# Patient Record
Sex: Male | Born: 1980 | Hispanic: Yes | Marital: Married | Smoking: Former smoker
Health system: Southern US, Community
[De-identification: ages and names within clinical notes are randomized; demographics above are authoritative.]

## PROBLEM LIST (undated history)

## (undated) DIAGNOSIS — K921 Melena: Secondary | ICD-10-CM

## (undated) DIAGNOSIS — M5127 Other intervertebral disc displacement, lumbosacral region: Secondary | ICD-10-CM

## (undated) DIAGNOSIS — K259 Gastric ulcer, unspecified as acute or chronic, without hemorrhage or perforation: Secondary | ICD-10-CM

## (undated) DIAGNOSIS — K219 Gastro-esophageal reflux disease without esophagitis: Secondary | ICD-10-CM

## (undated) DIAGNOSIS — K760 Fatty (change of) liver, not elsewhere classified: Secondary | ICD-10-CM

## (undated) HISTORY — DX: Gastro-esophageal reflux disease without esophagitis: K21.9

## (undated) HISTORY — DX: Gastric ulcer, unspecified as acute or chronic, without hemorrhage or perforation: K25.9

## (undated) HISTORY — DX: Melena: K92.1

## (undated) HISTORY — PX: KNEE ARTHROSCOPY: SHX127

## (undated) HISTORY — PX: UPPER GASTROINTESTINAL ENDOSCOPY: SHX188

---

## 1998-10-12 ENCOUNTER — Emergency Department (HOSPITAL_COMMUNITY): Admission: EM | Admit: 1998-10-12 | Discharge: 1998-10-12 | Payer: Self-pay | Admitting: Emergency Medicine

## 2000-10-28 ENCOUNTER — Encounter: Payer: Self-pay | Admitting: Gastroenterology

## 2000-10-28 ENCOUNTER — Ambulatory Visit (HOSPITAL_COMMUNITY): Admission: RE | Admit: 2000-10-28 | Discharge: 2000-10-28 | Payer: Self-pay | Admitting: Gastroenterology

## 2001-02-11 ENCOUNTER — Encounter (INDEPENDENT_AMBULATORY_CARE_PROVIDER_SITE_OTHER): Payer: Self-pay | Admitting: *Deleted

## 2001-02-11 ENCOUNTER — Ambulatory Visit (HOSPITAL_COMMUNITY): Admission: RE | Admit: 2001-02-11 | Discharge: 2001-02-11 | Payer: Self-pay | Admitting: Gastroenterology

## 2001-02-22 ENCOUNTER — Encounter: Payer: Self-pay | Admitting: Gastroenterology

## 2001-02-22 ENCOUNTER — Encounter: Admission: RE | Admit: 2001-02-22 | Discharge: 2001-02-22 | Payer: Self-pay | Admitting: Gastroenterology

## 2002-10-26 ENCOUNTER — Emergency Department (HOSPITAL_COMMUNITY): Admission: AD | Admit: 2002-10-26 | Discharge: 2002-10-26 | Payer: Self-pay | Admitting: Emergency Medicine

## 2005-07-21 ENCOUNTER — Emergency Department (HOSPITAL_COMMUNITY): Admission: EM | Admit: 2005-07-21 | Discharge: 2005-07-21 | Payer: Self-pay | Admitting: Family Medicine

## 2005-07-27 ENCOUNTER — Ambulatory Visit (HOSPITAL_COMMUNITY): Admission: RE | Admit: 2005-07-27 | Discharge: 2005-07-27 | Payer: Self-pay | Admitting: Orthopedic Surgery

## 2006-07-01 ENCOUNTER — Emergency Department (HOSPITAL_COMMUNITY): Admission: EM | Admit: 2006-07-01 | Discharge: 2006-07-01 | Payer: Self-pay | Admitting: Family Medicine

## 2009-05-09 ENCOUNTER — Emergency Department (HOSPITAL_COMMUNITY): Admission: EM | Admit: 2009-05-09 | Discharge: 2009-05-09 | Payer: Self-pay | Admitting: Family Medicine

## 2010-03-07 ENCOUNTER — Ambulatory Visit: Payer: Self-pay | Admitting: Family Medicine

## 2010-03-07 ENCOUNTER — Ambulatory Visit (HOSPITAL_COMMUNITY): Admission: RE | Admit: 2010-03-07 | Discharge: 2010-03-07 | Payer: Self-pay | Admitting: Family Medicine

## 2010-03-07 DIAGNOSIS — R635 Abnormal weight gain: Secondary | ICD-10-CM

## 2010-03-07 DIAGNOSIS — E669 Obesity, unspecified: Secondary | ICD-10-CM

## 2010-03-07 DIAGNOSIS — R42 Dizziness and giddiness: Secondary | ICD-10-CM

## 2010-03-10 ENCOUNTER — Encounter: Payer: Self-pay | Admitting: Family Medicine

## 2010-04-23 ENCOUNTER — Ambulatory Visit: Payer: Self-pay | Admitting: Family Medicine

## 2010-04-23 DIAGNOSIS — E781 Pure hyperglyceridemia: Secondary | ICD-10-CM | POA: Insufficient documentation

## 2010-05-19 ENCOUNTER — Emergency Department (HOSPITAL_COMMUNITY)
Admission: EM | Admit: 2010-05-19 | Discharge: 2010-05-19 | Payer: Self-pay | Source: Home / Self Care | Admitting: Emergency Medicine

## 2010-07-06 LAB — CONVERTED CEMR LAB
ALT: 21 units/L (ref 0–53)
AST: 16 units/L (ref 0–37)
Albumin: 4.8 g/dL (ref 3.5–5.2)
Alkaline Phosphatase: 72 units/L (ref 39–117)
BUN: 15 mg/dL (ref 6–23)
CO2: 23 meq/L (ref 19–32)
Calcium: 9.3 mg/dL (ref 8.4–10.5)
Chloride: 107 meq/L (ref 96–112)
Cholesterol: 151 mg/dL (ref 0–200)
Creatinine, Ser: 1 mg/dL (ref 0.40–1.50)
Glucose, Bld: 101 mg/dL — ABNORMAL HIGH (ref 70–99)
HCT: 44.6 % (ref 39.0–52.0)
HDL: 32 mg/dL — ABNORMAL LOW (ref >39)
Hemoglobin: 15 g/dL (ref 13.0–17.0)
LDL Cholesterol: 68 mg/dL (ref 0–99)
MCHC: 33.6 g/dL (ref 30.0–36.0)
MCV: 90.7 fL (ref 78.0–100.0)
Platelets: 262 10*3/uL (ref 150–400)
Potassium: 4.8 meq/L (ref 3.5–5.3)
RBC: 4.92 M/uL (ref 4.22–5.81)
RDW: 13.4 % (ref 11.5–15.5)
Sodium: 141 meq/L (ref 135–145)
TSH: 2.027 microintl units/mL (ref 0.350–4.500)
Total Bilirubin: 0.5 mg/dL (ref 0.3–1.2)
Total CHOL/HDL Ratio: 4.7
Total Protein: 7 g/dL (ref 6.0–8.3)
Triglycerides: 253 mg/dL — ABNORMAL HIGH (ref <150)
VLDL: 51 mg/dL — ABNORMAL HIGH (ref 0–40)
WBC: 6.9 10*3/uL (ref 4.0–10.5)

## 2010-07-08 NOTE — Assessment & Plan Note (Signed)
Summary: NP,tcb   Vital Signs:  Patient profile:   30 year old male Height:      173.5 cm Weight:      226 pounds BMI:     34.18 Temp:     98.5 degrees F oral Pulse rate:   70 / minute BP sitting:   122 / 78  (right arm) Cuff size:   regular  Vitals Entered By: Tessie Fass CMA (March 07, 2010 9:47 AM) CC: NEW PT Pain Assessment Patient in pain? no        CC:  NEW PT.  History of Present Illness: New patient to our office, visit conducted mostly in Spanish with interspersed English.   Wayne Miller presents with concerns about episodes of feeling "like I could pass out".  Sudden onset, associated with brief (seconds) episodes of nausea without emesis.  Mostly when goes from sitting to standing positions.  Has seconds of blurred vision.  No palpitations, chest pain or dyspnea.  No fevers or chills.  Has had increased weight (25 lbs in the past 6 months since stopped working).  Had been working in [employer], not working for 6 months.  At home caring for 3 daughters (Ages 47, 8, 2 yrs).  Wife is working.  He is not physically active.  No tobacco or drugs.  Drinks 3 to 6 beers at a sitting, one or two times a week to relax.    Habits & Providers  Alcohol-Tobacco-Diet     Tobacco Status: never  Family History: Father with DM, HTN, ESRD on HD, heart disease.  Mother wiht HTN, highcholesterol, 2 sisters and 3 brothers, none of whom have DM or known heart disease.   Social History: Former smoker (quit years ago).  Drinks 3 to 6 beers on a sitting, a few times a week.  Caring for three daughter (ages 2, 19, 37).  Wife working now.  Formerly worked in [employer] representative. Smoking Status:  never  Review of Systems       Reports weight gain 25lbs in 6 mos; denies fevers or chills, no chest pain, no diarrhea or constipation, no blood in stool, no emesis. no fainting (but has had presyncope), no polyphagia or polydipsia, no polyuria or dysuria.  No abd pain.    Physical Exam  General:   well appearing, no apparent distress.   Eyes:  clear sclerae. PERRL.  Ears:  External ear exam shows no significant lesions or deformities.  Otoscopic examination reveals clear canals, tympanic membranes are intact bilaterally without bulging, retraction, inflammation or discharge. Hearing is grossly normal bilaterally. Mouth:  Mildly dry mucus membranes noted. Clear oropharynx. Neck:  Neck supple. No adenopathy noted.  Possible early acanthosis around nape of neck.  Thyroid supple, non-nodular.  Lungs:  Normal respiratory effort, chest expands symmetrically. Lungs are clear to auscultation, no crackles or wheezes. Heart:  Normal rate and regular rhythm. S1 and S2 normal without gallop, murmur, click, rub or other extra sounds. Abdomen:  Bowel sounds positive,abdomen soft and non-tender without masses, organomegaly or hernias noted. No organomegaly.  Extremities:  No edema in lower extremities.  Palpable dp pulses bilaterally.  Neurologic:  gait normal.     Impression & Recommendations:  Problem # 1:  DIZZINESS (ICD-780.4) Episodes presyncope.  No LOC ever, per his report.  ECG today with [phone] (not prolonged).  Consider Holter monitor if episodes frequent enough to capture.  Will workup for possible DM, consider orthostatic BP at next visit if labs not helpful in delineating a cause.  Orders: 12 Lead EKG (12 Lead EKG) TSH-[facility] ([id number])  Problem # 2:  WEIGHT GAIN (ICD-783.1) Likely secondary to inactivity, possibly increased eating due to being out of work.  COnsider PHQ9 for depression screen at next visit.  Labs and lipid profile to assess for risk.  Orders: Lipid-[facility] [phone]) Comp Met-[facility] [id number]) CBC-[facility] [phone]) TSH[facility] [phone])  Other Orders: Influenza Vaccine NON MCR (22025)  Patient Instructions: 1)  Fue un placer verle hoy.  2)  Mandamos hacer varios estudios de sangre y Mudlogger.  Le entro en [employer]. 3)  Mantenga un  cuaderno con las fechas y horas en que le pasan los episodios.  Quiero que traiga este record cuando nos veamos proximamente.  4)  Quiero que vuelva en 4 semanas.  5)  FOLLOWUP WITH DR Mauricio Po IN 3 to 5 WEEKS. 6)  FLU SHOT TODAY 7)  ECG TODAY   Immunizations Administered:  Influenza Vaccine # 1:    Vaccine Type: Fluvax Non-MCR    Site: left deltoid    Mfr: GlaxoSmithKline    Dose: 0.5 ml    Route: IM    Given by: Tessie Fass CMA    Exp. Date: 12/03/2010    Lot #: [id number]    VIS given: 12/31/09 version given March 07, 2010.  Flu Vaccine Consent Questions:    Do you have a history of severe allergic reactions to this vaccine? no    Any prior history of allergic reactions to egg and/or gelatin? no    Do you have a sensitivity to the preservative Thimersol? no    Do you have a past history of Guillan-Barre Syndrome? no    Do you currently have an acute febrile illness? no    Have you ever had a severe reaction to latex? no    Vaccine information given and explained to patient? yes

## 2010-07-08 NOTE — Assessment & Plan Note (Signed)
Summary: fu/mj   Vital Signs:  Patient profile:   30 year old male Weight:      227 pounds Temp:     98.0 degrees F oral Pulse rate:   65 / minute BP sitting:   124 / 80  (right arm) Cuff size:   regular  Vitals Entered By: Tessie Fass CMA (April 23, 2010 8:58 AM) CC: F/U Is Patient Diabetic? No Pain Assessment Patient in pain? no        CC:  F/U.  History of Present Illness: Visit conducted in [place].   Wayne Miller comes in today for followup.  His sxs of presyncope have completely resolved.  No further episodes since our last meeting.  PHQ9 done today in Spanish is zero.   He did receive my letter regarding his labs, specifically the abnormally high TGs and low HDL.  Discussed diet and activity.  Not particularly active now, watches his daughters while wife works.  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    Eats a fair amt of bread, some white rice.  Tortillas when goes to visit his mother.  Not much of a sweet tooth.  Drinks 6-pack of beer once every month or so.  No other alcohol.  Takes OTC fish oil caps, 2 caps daily.  No adverse symptoms from these.   Nonsmoker (never smoked).   Also concerned about discoloration of toenails.  Wouldl like to consider treatment for this.   Habits & Providers  Alcohol-Tobacco-Diet     Tobacco Status: never  Current Medications (verified): 1)  Terbinafine Hcl 250 Mg Tabs (Terbinafine Hcl) .... Sig: Take 1 Tab By Mouth One Time Daily For 3 Months  Allergies (verified): No Known Drug Allergies  Physical Exam  General:  well appearing, no apparent distress Mouth:  moist mucus membranes Neck:  neck supple withuot adenopathy Lungs:  Normal respiratory effort, chest expands symmetrically. Lungs are clear to auscultation, no crackles or wheezes. Heart:  Normal rate and regular rhythm. S1 and S2 normal without gallop, murmur, click, rub or other extra sounds. Msk:  toenails and fingernails with yellow discoloration; gnawed  appearance at peripheral edges. No nailbed erythema or paronychiae   Impression & Recommendations:  Problem # 1:  HYPERTRIGLYCERIDEMIA (ICD-272.1) Discussed possible causes of this, as well as association with impaired fasting glucose (fasting glucose 101 on this study).  Weight reduction, watch dietary items that can increase TGs.  Increase exercise and fish oils for increase in HDL (beneficial).  To recheck lipid panel in 2 months or so.  Orders: [facility]- Est Level  3 (99213)Future Orders: Lipid-[facility] ([id number]) ... 04/23/2011  Problem # 2:  ONYCHOMYCOSIS, BILATERAL (ICD-110.1) Given normal transaminases, will treat with 12 weeks of oral lamisil.  Discussed expectation that this may take 1 yr to note full recovery.  His updated medication list for this problem includes:    Terbinafine Hcl 250 Mg Tabs (Terbinafine hcl) ..... Sig: take 1 tab by mouth one time daily for 3 months  Orders: [facility]- Est Level  3 (99213)Future Orders: Comp Met-[facility] ([id number]) ... 04/22/2011  Problem # 3:  DIZZINESS (ICD-780.4) Presyncope resolved. PHQ9 done today to assess for possible depressive disorder as cause.  PHQ9 is zero.  No further workup for this.   Complete Medication List: 1)  Terbinafine Hcl 250 Mg Tabs (Terbinafine hcl) .... Sig: take 1 tab by mouth one time daily for 3 months  Patient Instructions: 1)  Fue un placer verle hoy. 2)  Para bajar los triglicerios, quiero que  limite el pan, la pasta, y el arroz blanco en la dieta.  Si va a comer pan, es mejor que sea pan integral.   3)  Limitar los dulces, pasteles y el alcohol.  4)  Aumentar el consumo de vegetales, sobretodo los que contienen [address] (brocoli, col, espinaca, [place], frijoles).  5)  Quiero que aumente el aceite de pescado a 4 capsulas por dia (2 por la [employer] y [place] por la tarde).   6)  Trate de hacer mas actividad fisica, aunque sea por 20 minutos diario de caminar.   7)  QUiero chequear el laboratorio EN AYUNAS 8 HORAS en el  mes de enero.  8)  Mande' una receta para Luana Shu para los hongos de los pies y  de las unas de las [place], a Statistician en [employer] con la [address].  Debe tomar una tableta por dia, por 3 meses.  Los [employer] unas pueden demorar meses para ser notables.   Prescriptions: TERBINAFINE HCL 250 MG TABS (TERBINAFINE HCL) SIG: Take 1 tab by mouth one time daily for 3 months  #30 x 3   Entered and Authorized by:   Paula Compton MD   Signed by:   Paula Compton MD on 04/23/2010   Method used:   Electronically to        [facility] [phone]* (retail)       [address]       [place], [place]  [address]       Ph: [phone]       Fax: [phone]   RxID:   [id number]    Orders Added: 1)  Lipid-[facility] [[id number]] 2)  Comp Met-[facility] [[id number]] 3)  [facility]- Est Level  3 [60737]

## 2010-07-08 NOTE — Letter (Signed)
Summary: Generic Letter  [facility]  [address]   [place], [place] [address]   Phone: [phone]  Fax: [phone]    03/10/2010  [facility] [address] [place], [place]  [address]  Lewis Shock,   Espero que esta carta le encuentre bien.  Escribo para decire que la [employer] de los estudios que mandamos hacer el pasado viernes, 30 septiembre, salieron bien.   El perfil lipidico(colesterol) salio' con algunos resultados anormales que debemos conversar en su proxima visita.   Quisiera volver a verle en la consulta dentro de seis semanas.    Sinceramente,   Paula Compton MD  Appended Document: Generic Letter mailed

## 2010-08-01 ENCOUNTER — Encounter: Payer: Self-pay | Admitting: *Deleted

## 2010-09-09 LAB — POCT RAPID STREP A (OFFICE): Streptococcus, Group A Screen (Direct): POSITIVE — AB

## 2010-10-24 NOTE — Op Note (Signed)
[facility]  Patient:    Wayne Miller, Wayne Miller Visit Number: [id number] MRN: [id number]          Service Type: END Location: ENDO Attending Physician:  Charna Elizabeth Dictated by:   Anselmo Rod, M.D. Proc. Date: 02/11/01 Admit Date:  02/11/2001   CC:         Davis L. Cloward, M.D.   Operative Report  DATE OF BIRTH:  11/04/1980.  PROCEDURE:  Esophagogastroduodenoscopy with biopsies.  ENDOSCOPIST:  Anselmo Rod, M.D.  INSTRUMENT USED:  Olympus video panendoscope.  INDICATION FOR PROCEDURE:  Epigastric pain not responding to PPIs in a 30 year old Hispanic male.  Rule out peptic ulcer disease.  PREPROCEDURE PREPARATION:  Informed consent was procured from the patient. The patient was fasted for eight hours prior to the procedure.  PREPROCEDURE PHYSICAL:  VITAL SIGNS:  The patient had stable vital signs.  NECK:  Supple.  CHEST:  Clear to auscultation.  S1, S2 regular.  ABDOMEN:  Soft with epigastric tenderness on palpation with guarding.  No rebound or rigidity.  DESCRIPTION OF PROCEDURE:  The patient was placed in the left lateral decubitus position and sedated with 60 mg of Demerol and 6 mg of Versed intravenously.  Once the patient was adequately sedate and maintained on low-flow oxygen and continuous cardiac monitoring, the Olympus video panendoscope was advanced through the mouthpiece, over the tongue, into the esophagus under direct vision.  The entire esophagus appeared normal and without lesions.  There was a small hiatal hernia seen on retroflexion with antral gastritis.  Biopsies were done to rule out the presence of Helicobacter pylori by CLOtest.  The proximal small bowel appeared normal.  There was no evidence of ulceration.  IMPRESSION: 1. Small hiatal hernia. 2. Antral gastritis. 3. Biopsies done to rule out Helicobacter pylori by pathology.  RECOMMENDATIONS: 1. Await pathology results. 2. Continue  PPIs. 3. Avoid all nonsteroidals. 4. Outpatient follow-up in the next two weeks. Dictated by:   Anselmo Rod, M.D. Attending Physician:  Charna Elizabeth DD:  02/11/01 TD:  02/12/01 Job: [id number]

## 2011-01-19 ENCOUNTER — Inpatient Hospital Stay (INDEPENDENT_AMBULATORY_CARE_PROVIDER_SITE_OTHER)
Admission: RE | Admit: 2011-01-19 | Discharge: 2011-01-19 | Disposition: A | Discharge status: Home / Self Care | Payer: PRIVATE HEALTH INSURANCE | Source: Ambulatory Visit | Attending: Family Medicine | Admitting: Family Medicine

## 2011-01-19 DIAGNOSIS — L03039 Cellulitis of unspecified toe: Secondary | ICD-10-CM

## 2011-08-25 ENCOUNTER — Other Ambulatory Visit: Payer: Self-pay | Admitting: Family Medicine

## 2011-08-25 NOTE — Telephone Encounter (Signed)
Refill request

## 2015-05-17 ENCOUNTER — Emergency Department (HOSPITAL_COMMUNITY)
Admission: EM | Admit: 2015-05-17 | Discharge: 2015-05-17 | Disposition: A | Discharge status: Home / Self Care | Payer: Worker's Compensation | Attending: Emergency Medicine | Admitting: Emergency Medicine

## 2015-05-17 ENCOUNTER — Encounter (HOSPITAL_COMMUNITY): Payer: Self-pay | Admitting: Emergency Medicine

## 2015-05-17 ENCOUNTER — Emergency Department (HOSPITAL_COMMUNITY)
Admission: EM | Admit: 2015-05-17 | Discharge: 2015-05-17 | Discharge status: Absent without leave | Payer: Self-pay | Source: Home / Self Care

## 2015-05-17 DIAGNOSIS — M545 Low back pain: Secondary | ICD-10-CM | POA: Diagnosis not present

## 2015-05-17 LAB — URINALYSIS, ROUTINE W REFLEX MICROSCOPIC
Bilirubin Urine: NEGATIVE
GLUCOSE, UA: NEGATIVE mg/dL
HGB URINE DIPSTICK: NEGATIVE
Ketones, ur: NEGATIVE mg/dL
Leukocytes, UA: NEGATIVE
Nitrite: NEGATIVE
PH: 7 (ref 5.0–8.0)
Protein, ur: NEGATIVE mg/dL
SPECIFIC GRAVITY, URINE: 1.009 (ref 1.005–1.030)

## 2015-05-17 MED ORDER — OXYCODONE-ACETAMINOPHEN 5-325 MG PO TABS
1.0000 | ORAL_TABLET | Freq: Once | ORAL | Status: AC
  Administered 2015-05-17: 1 via ORAL

## 2015-05-17 MED ORDER — KETOROLAC TROMETHAMINE 60 MG/2ML IM SOLN
60.0000 mg | Freq: Once | INTRAMUSCULAR | Status: AC
  Administered 2015-05-17: 60 mg via INTRAMUSCULAR
  Filled 2015-05-17: qty 2

## 2015-05-17 MED ORDER — HYDROCODONE-ACETAMINOPHEN 5-325 MG PO TABS: 2.0000 | ORAL_TABLET | ORAL | Status: DC | PRN

## 2015-05-17 MED ORDER — OXYCODONE-ACETAMINOPHEN 5-325 MG PO TABS
ORAL_TABLET | ORAL | Status: AC
  Filled 2015-05-17: qty 1

## 2015-05-17 MED ORDER — METHOCARBAMOL 500 MG PO TABS: 500.0000 mg | ORAL_TABLET | Freq: Two times a day (BID) | ORAL | Status: DC

## 2015-05-17 MED ORDER — DICLOFENAC SODIUM 50 MG PO TBEC: 50.0000 mg | DELAYED_RELEASE_TABLET | Freq: Two times a day (BID) | ORAL | Status: DC

## 2015-05-17 NOTE — ED Notes (Signed)
Per pt, started having bilateral flank pain since yesterday, denies urinary symptoms or fevers. Pt in NAD at this time, ambulatory.

## 2015-05-17 NOTE — Discharge Instructions (Signed)
Dolor de espalda en adultos  (Back Pain, Adult)  El dolor de espalda es muy frecuente en los adultos. La causa del dolor de espalda es rara vez peligrosa y el dolor a menudo mejora con el tiempo. Es posible que se desconozca la causa de esta afección. Algunas causas comunes son las siguientes:  · Distensión de los músculos o ligamentos que sostienen la columna vertebral.  · Desgaste (degeneración) de los discos vertebrales.  · Artritis.  · Lesiones directas en la espalda.  En muchas personas, el dolor de espalda es recurrente. Como rara vez es peligroso, las personas pueden aprender a manejar esta afección por sí mismas.  INSTRUCCIONES PARA EL CUIDADO EN EL HOGAR  Controle su dolor de espalda a fin de detectar algún cambio. Las siguientes indicaciones ayudarán a aliviar cualquier molestia que pueda sentir:  · Permanezca activo. Si permanece sentado o de pie en un mismo lugar durante mucho tiempo, se tensiona la espalda. No se siente, conduzca o permanezca de pie en un mismo lugar durante más de 30 minutos seguidos. Realice caminatas cortas en superficies planas tan pronto como le sea posible. Trate de caminar un poco más de tiempo cada día.  · Haga ejercicio regularmente como se lo haya indicado el médico. El ejercicio ayuda a que su espalda se cure más rápidamente. También ayuda a prevenir futuras lesiones al mantener los músculos fuertes y flexibles.  · No permanezca en la cama. Si hace reposo más de 1 a 2 días, puede demorar su recuperación.  · Preste atención a su cuerpo al inclinarse y levantarse. Las posiciones más cómodas son las que ejercen menos tensión en la espalda en recuperación. Siempre use técnicas apropiadas para levantar objetos, como por ejemplo:    Flexionar las rodillas.    Mantener la carga cerca del cuerpo.    No torcerse.  · Encuentre una posición cómoda para dormir. Use un colchón firme y recuéstese de costado con las rodillas ligeramente flexionadas. Si se recuesta sobre la espalda, coloque  una almohada debajo de las rodillas.  · Evite sentir ansiedad o estrés. El estrés aumenta la tensión muscular y puede empeorar el dolor de espalda. Es importante reconocer si se siente ansioso o estresado y aprender maneras de controlarlo, por ejemplo haciendo ejercicio.  · Tome los medicamentos solamente como se lo haya indicado el médico. Los medicamentos de venta libre para aliviar el dolor y la inflamación a menudo son los más eficaces. El médico puede recetarle relajantes musculares. Estos medicamentos ayudan a calmar el dolor de modo que pueda reanudar más rápidamente sus actividades normales y el ejercicio saludable.  · Aplique hielo sobre la zona lesionada.    Ponga el hielo en una bolsa plástica.    Coloque una toalla entre la piel y la bolsa de hielo.    Deje el hielo durante 20 minutos, 2 a 3 veces por día, durante los primeros 2 o 3 días. Después de eso, puede alternar el hielo y el calor para reducir el dolor y los espasmos.  · Mantenga un peso saludable. El exceso de peso ejerce presión adicional sobre la espalda y hace que resulte difícil mantener una buena postura.  SOLICITE ATENCIÓN MÉDICA SI:  · Siente un dolor que no se alivia con reposo o medicamentos.  · Siente mucho dolor que se extiende a las piernas o los glúteos.  · El dolor no mejora en una semana.  · Siente dolor por la noche.  · Pierde peso.  · Siente escalofríos o fiebre.  SOLICITE ATENCIÓN MÉDICA DE INMEDIATO SI:   ·   Tiene nuevos problemas para controlar la vejiga o los intestinos.  · Siente debilidad o adormecimiento inusuales en los brazos o en las piernas.  · Siente náuseas o vómitos.  · Siente dolor abdominal.  · Siente que va a desmayarse.     Esta información no tiene como fin reemplazar el consejo del médico. Asegúrese de hacerle al médico cualquier pregunta que tenga.     Document Released: 05/25/2005 Document Revised: 06/15/2014  Elsevier Interactive Patient Education ©2016 Elsevier Inc.

## 2015-05-17 NOTE — ED Provider Notes (Signed)
CSN: [id number]     Arrival date & time 05/17/15  1319 History   First MD Initiated Contact with Patient 05/17/15 1652     Chief Complaint  Patient presents with  . Flank Pain     (Consider location/radiation/quality/duration/timing/severity/associated sxs/prior Treatment) Patient is a 34 y.o. male presenting with back pain. The history is provided by the patient. No language interpreter was used.  Back Pain Location:  Lumbar spine Quality:  Aching and stabbing Radiates to:  Does not radiate Pain severity:  Severe Pain is:  Same all the time Onset quality:  Gradual Duration:  1 day Timing:  Constant Progression:  Worsening Chronicity:  New Context: not physical stress and not recent illness   Relieved by:  Nothing Worsened by:  Nothing tried Ineffective treatments:  None tried Associated symptoms: no abdominal pain, no fever, no leg pain, no tingling and no weakness     History reviewed. No pertinent past medical history. History reviewed. No pertinent past surgical history. No family history on file. Social History  Substance Use Topics  . Smoking status: Never Smoker   . Smokeless tobacco: None  . Alcohol Use: No    Review of Systems  Constitutional: Negative for fever.  Gastrointestinal: Negative for abdominal pain.  Musculoskeletal: Positive for back pain.  Neurological: Negative for tingling and weakness.  All other systems reviewed and are negative.     Allergies  Review of patient's allergies indicates no known allergies.  Home Medications   Prior to Admission medications   Medication Sig Start Date End Date Taking? Authorizing Provider  terbinafine (LAMISIL) 250 MG tablet Take 250 mg by mouth daily. For 3 months     Historical Provider, MD   BP 149/99 mmHg  Pulse 75  Temp(Src) 98 F (36.7 C) (Oral)  Resp 16  SpO2 99% Physical Exam  Constitutional: He appears well-developed and well-nourished.  HENT:  Head: Normocephalic.  Right Ear: External  ear normal.  Left Ear: External ear normal.  Nose: Nose normal.  Mouth/Throat: Oropharynx is clear and moist.  Eyes: Pupils are equal, round, and reactive to light.  Neck: Normal range of motion.  Cardiovascular: Normal rate and normal heart sounds.   Pulmonary/Chest: Effort normal and breath sounds normal.  Abdominal: Soft.  Musculoskeletal: Normal range of motion.  Tender ls spine  Pain with movement, pain with bilat straight leg raise.    Neurological: He is alert.  Skin: Skin is warm.  Psychiatric: He has a normal mood and affect.  Nursing note and vitals reviewed.   ED Course  Procedures (including critical care time) Labs Review Labs Reviewed  URINALYSIS, ROUTINE W REFLEX MICROSCOPIC (NOT AT [facility])    Imaging Review No results found. I have personally reviewed and evaluated these images and lab results as part of my medical decision-making.   EKG Interpretation None      MDM pt reports no relief with percocet one here.  Pt given torodol IM.  Urine no blood.   I think pain is muscular. Pt reports feeling better after torodol.  Pt given rx for ibuprofen, robaxin and hydrocodone    Final diagnoses:  Low back pain, unspecified back pain laterality, with sciatica presence unspecified    Meds ordered this encounter  Medications  . oxyCODONE-acetaminophen (PERCOCET/ROXICET) 5-325 MG per tablet 1 tablet    Sig:   . oxyCODONE-acetaminophen (PERCOCET/ROXICET) 5-325 MG per tablet    Sig:     Ocie Bob   : cabinet override  . ketorolac (  TORADOL) injection 60 mg    Sig:   . methocarbamol (ROBAXIN) 500 MG tablet    Sig: Take 1 tablet (500 mg total) by mouth 2 (two) times daily.    Dispense:  20 tablet    Refill:  0    Order Specific Question:  Supervising Provider    Answer:  MILLER, BRIAN [[id number]]  . HYDROcodone-acetaminophen (NORCO/VICODIN) 5-325 MG tablet    Sig: Take 2 tablets by mouth every 4 (four) hours as needed.    Dispense:  16 tablet    Refill:  0     Order Specific Question:  Supervising Provider    Answer:  MILLER, BRIAN [[id number]]  . methocarbamol (ROBAXIN) 500 MG tablet    Sig: Take 1 tablet (500 mg total) by mouth 2 (two) times daily.    Dispense:  20 tablet    Refill:  0    Order Specific Question:  Supervising Provider    Answer:  MILLER, BRIAN [[id number]]  . diclofenac (VOLTAREN) 50 MG EC tablet    Sig: Take 1 tablet (50 mg total) by mouth 2 (two) times daily.    Dispense:  20 tablet    Refill:  0    Order Specific Question:  Supervising Provider    Answer:  Noemi Chapel [[id number]]    An After Visit Summary was printed and given to the patient.  Hollace Kinnier [place], PA-C 05/17/15 1904  Dorie Rank, MD 05/18/15 [phone]

## 2015-05-23 ENCOUNTER — Emergency Department (HOSPITAL_COMMUNITY): Payer: Worker's Compensation

## 2015-05-23 ENCOUNTER — Emergency Department (HOSPITAL_COMMUNITY)
Admission: EM | Admit: 2015-05-23 | Discharge: 2015-05-23 | Disposition: A | Discharge status: Home / Self Care | Payer: Worker's Compensation | Attending: Emergency Medicine | Admitting: Emergency Medicine

## 2015-05-23 ENCOUNTER — Encounter (HOSPITAL_COMMUNITY): Payer: Self-pay | Admitting: *Deleted

## 2015-05-23 DIAGNOSIS — Z79899 Other long term (current) drug therapy: Secondary | ICD-10-CM | POA: Insufficient documentation

## 2015-05-23 DIAGNOSIS — Z791 Long term (current) use of non-steroidal anti-inflammatories (NSAID): Secondary | ICD-10-CM | POA: Insufficient documentation

## 2015-05-23 DIAGNOSIS — M7918 Myalgia, other site: Secondary | ICD-10-CM

## 2015-05-23 DIAGNOSIS — M545 Low back pain: Secondary | ICD-10-CM | POA: Diagnosis not present

## 2015-05-23 MED ORDER — NAPROXEN 500 MG PO TABS: 500.0000 mg | ORAL_TABLET | Freq: Two times a day (BID) | ORAL | Status: DC

## 2015-05-23 NOTE — ED Provider Notes (Signed)
CSN: [id number]     Arrival date & time 05/23/15  0810 History  By signing my name below, I, Wayne Miller, attest that this documentation has been prepared under the direction and in the presence of Margarita Mail, PA-C.  Electronically Signed: Eustaquio Miller, ED Scribe. 05/23/2015. 9:31 AM.   Chief Complaint  Patient presents with  . Back Pain   The history is provided by the patient. The history is limited by a language barrier. A language interpreter was used.     HPI Comments: Wayne Miller is a 34 y.o. male who presents to the Emergency Department complaining of gradual onset, constant, unchanged, 810, lower back pain radiating to bilateral posterior upper legs x 8 days. The pain is exacerbated with prolonged walking, prolonged standing still, twisting, and turning. 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 He was seen in th ED on 05/17/2015 (1 week ago) for the same symptoms and given prescriptions for Robaxin, Norco, and Voltaren. Pt reports that the medications have been giving him relief but he still has pain when he is not taken the meds. He denies weakness, numbness, frequency, urgency, dysuria, unexpected weight loss, loss of appetite, fever, night sweats, or any other associated symptoms.    History reviewed. No pertinent past medical history. History reviewed. No pertinent past surgical history. History reviewed. No pertinent family history. Social History  Substance Use Topics  . Smoking status: Never Smoker   . Smokeless tobacco: None  . Alcohol Use: No    Review of Systems  Constitutional: Negative for fever, diaphoresis, appetite change and unexpected weight change.  Genitourinary: Negative for dysuria, urgency and frequency.  Musculoskeletal: Positive for back pain.  Neurological: Negative for weakness and numbness.  All other systems reviewed and are negative.   Allergies  Review of patient's allergies indicates no known allergies.  Home  Medications   Prior to Admission medications   Medication Sig Start Date End Date Taking? Authorizing Provider  diclofenac (VOLTAREN) 50 MG EC tablet Take 1 tablet (50 mg total) by mouth 2 (two) times daily. 05/17/15  Yes Fransico Meadow, PA-C  HYDROcodone-acetaminophen (NORCO/VICODIN) 5-325 MG tablet Take 2 tablets by mouth every 4 (four) hours as needed. 05/17/15  Yes Hollace Kinnier Sofia, PA-C  methocarbamol (ROBAXIN) 500 MG tablet Take 1 tablet (500 mg total) by mouth 2 (two) times daily. 05/17/15  Yes Hollace Kinnier Sofia, PA-C  methocarbamol (ROBAXIN) 500 MG tablet Take 1 tablet (500 mg total) by mouth 2 (two) times daily. 05/17/15  Yes Hollace Kinnier Sofia, PA-C  terbinafine (LAMISIL) 250 MG tablet Take 250 mg by mouth daily. For 3 months     Historical Provider, MD   Triage Vitals: BP 141/93 mmHg  Pulse 78  Temp(Src) 98.7 F (37.1 C) (Oral)  Resp 18  SpO2 98%   Physical Exam  Physical Exam  Constitutional: Pt appears well-developed and well-nourished. No distress.  HENT:  Head: Normocephalic and atraumatic.  Mouth/Throat: Oropharynx is clear and moist. No oropharyngeal exudate.  Eyes: Conjunctivae are normal.  Neck: Normal range of motion. Neck supple.  Full ROM without pain  Cardiovascular: Normal rate, regular rhythm and intact distal pulses.   Pulmonary/Chest: Effort normal and breath sounds normal. No respiratory distress. Pt has no wheezes.  Abdominal: Soft. Pt exhibits no distension. There is no tenderness.  Musculoskeletal:  Full range of motion of the T-spine and L-spine No tenderness to palpation of the spinous processes of the T-spine or L-spine Mild tenderness  to palpation of the paraspinous muscles of the L-spine  Lymphadenopathy:    Pt has no cervical adenopathy.  Neurological: Pt is alert. Pt has normal reflexes.  Reflex Scores:      Bicep reflexes are 2+ on the right side and 2+ on the left side.      Brachioradialis reflexes are 2+ on the right side and 2+ on the left side.       Patellar reflexes are 2+ on the right side and 2+ on the left side.      Achilles reflexes are 2+ on the right side and 2+ on the left side. Speech is clear and goal oriented, follows commands Normal 5/5 strength in upper and lower extremities bilaterally including dorsiflexion and plantar flexion, strong and equal grip strength Sensation normal to light and sharp touch Moves extremities without ataxia, coordination intact Normal gait Normal balance No Clonus   Skin: Skin is warm and dry. No rash noted. Pt is not diaphoretic. No erythema.  Psychiatric: Pt has a normal mood and affect. Behavior is normal.  Nursing note and vitals reviewed.   ED Course  Procedures (including critical care time)  DIAGNOSTIC STUDIES: Oxygen Saturation is 98% on RA, normal by my interpretation.    COORDINATION OF CARE: 9:26 AM-Discussed treatment plan which includes DG L Spine with pt at bedside and pt agreed to plan.   Labs Review Labs Reviewed - No data to display  Imaging Review Dg Lumbar Spine Complete  05/23/2015  CLINICAL DATA:  Lumbago with radicular symptoms for 8 days EXAM: LUMBAR SPINE - COMPLETE 4+ VIEW COMPARISON:  None. FINDINGS: Frontal, lateral, spot lumbosacral lateral, and bilateral oblique views were obtained. There are 5 non-rib-bearing lumbar type vertebral bodies. There is no fracture or spondylolisthesis. The disc spaces appear normal. There is no appreciable facet arthropathy. IMPRESSION: No fracture or spondylolisthesis.  No appreciable arthropathy. Electronically Signed   By: Lowella Grip III M.D.   On: 05/23/2015 09:50   I have personally reviewed and evaluated these images as part of my medical decision-making.   EKG Interpretation None      MDM   Final diagnoses:  Lumbar muscle pain   Patient with back pain.  No neurological deficits and normal neuro exam.  Patient is ambulatory.  No loss of bowel or bladder control.  No concern for cauda equina.  No fever,  night sweats, weight loss, h/o cancer, IVDA, no recent procedure to back. No urinary symptoms suggestive of UTI.  Supportive care and return precaution discussed. Appears safe for discharge at this time. Follow up as indicated in discharge paperwork.    I personally performed the services described in this documentation, which was scribed in my presence. The recorded information has been reviewed and is accurate.         Margarita Mail, PA-C 05/23/15 0957  Tanna Furry, MD 05/26/15 [phone]

## 2015-05-23 NOTE — ED Notes (Signed)
SEE PA assessment.

## 2015-05-23 NOTE — ED Notes (Signed)
Pt returns today for ongoing pain since last week. Pt reports back pain remains the same. Pain score 8/10 located in mid back and on Rt/LT side.

## 2015-05-23 NOTE — ED Notes (Signed)
Declined W/C at D/C and was escorted to lobby by RN. 

## 2015-05-23 NOTE — Discharge Instructions (Signed)
Dolor de espalda en adultos °(Back Pain, Adult) °El dolor de espalda es muy frecuente en los adultos. La causa del dolor de espalda es rara vez peligrosa y el dolor a menudo mejora con el tiempo. Es posible que se desconozca la causa de esta afección. Algunas causas comunes son las siguientes: °· Distensión de los músculos o ligamentos que sostienen la columna vertebral. °· Desgaste (degeneración) de los discos vertebrales. °· Artritis. °· Lesiones directas en la espalda. °En muchas personas, el dolor de espalda es recurrente. Como rara vez es peligroso, las personas pueden aprender a manejar esta afección por sí mismas. °INSTRUCCIONES PARA EL CUIDADO EN EL HOGAR °Controle su dolor de espalda a fin de detectar algún cambio. Las siguientes indicaciones ayudarán a aliviar cualquier molestia que pueda sentir: °· Permanezca activo. Si permanece sentado o de pie en un mismo lugar durante mucho tiempo, se tensiona la espalda. No se siente, conduzca o permanezca de pie en un mismo lugar durante más de 30 minutos seguidos. Realice caminatas cortas en superficies planas tan pronto como le sea posible. Trate de caminar un poco más de tiempo cada día. °· Haga ejercicio regularmente como se lo haya indicado el médico. El ejercicio ayuda a que su espalda se cure más rápidamente. También ayuda a prevenir futuras lesiones al mantener los músculos fuertes y flexibles. °· No permanezca en la cama. Si hace reposo más de 1 a 2 días, puede demorar su recuperación. °· Preste atención a su cuerpo al inclinarse y levantarse. Las posiciones más cómodas son las que ejercen menos tensión en la espalda en recuperación. Siempre use técnicas apropiadas para levantar objetos, como por ejemplo: °· Flexionar las rodillas. °· Mantener la carga cerca del cuerpo. °· No torcerse. °· Encuentre una posición cómoda para dormir. Use un colchón firme y recuéstese de costado con las rodillas ligeramente flexionadas. Si se recuesta sobre la espalda, coloque  una almohada debajo de las rodillas. °· Evite sentir ansiedad o estrés. El estrés aumenta la tensión muscular y puede empeorar el dolor de espalda. Es importante reconocer si se siente ansioso o estresado y aprender maneras de controlarlo, por ejemplo haciendo ejercicio. °· Tome los medicamentos solamente como se lo haya indicado el médico. Los medicamentos de venta libre para aliviar el dolor y la inflamación a menudo son los más eficaces. El médico puede recetarle relajantes musculares. Estos medicamentos ayudan a calmar el dolor de modo que pueda reanudar más rápidamente sus actividades normales y el ejercicio saludable. °· Aplique hielo sobre la zona lesionada. °· Ponga el hielo en una bolsa plástica. °· Coloque una toalla entre la piel y la bolsa de hielo. °· Deje el hielo durante 20 minutos, 2 a 3 veces por día, durante los primeros 2 o 3 días. Después de eso, puede alternar el hielo y el calor para reducir el dolor y los espasmos. °· Mantenga un peso saludable. El exceso de peso ejerce presión adicional sobre la espalda y hace que resulte difícil mantener una buena postura. °SOLICITE ATENCIÓN MÉDICA SI: °· Siente un dolor que no se alivia con reposo o medicamentos. °· Siente mucho dolor que se extiende a las piernas o los glúteos. °· El dolor no mejora en una semana. °· Siente dolor por la noche. °· Pierde peso. °· Siente escalofríos o fiebre. °SOLICITE ATENCIÓN MÉDICA DE INMEDIATO SI:  °· Tiene nuevos problemas para controlar la vejiga o los intestinos. °· Siente debilidad o adormecimiento inusuales en los brazos o en las piernas. °· Siente náuseas o vómitos. °· Siente dolor abdominal. °· Siente que va a desmayarse. °  °  Esta informacin no tiene Marine scientist el consejo del mdico. Asegrese de hacerle al mdico cualquier pregunta que tenga.   Document Released: 05/25/2005 Document Revised: 06/15/2014 Elsevier Interactive Patient Education 2016 [place] para la espalda (Back  Exercises) Los siguientes ejercicios fortalecen los msculos que dan soporte a la espalda y, [place], ayudan a Theatre manager la flexibilidad de la zona lumbar. Hacer estos ejercicios puede ser de ayuda para Information systems manager de espalda o Best boy actual. Si tiene dolor o molestias en la espalda, intente hacer estos ejercicios 2 o 3veces por da, o como se lo haya indicado el mdico. Cuando el dolor desaparezca, hgalos una vez por da, pero haga ms repeticiones de cada ejercicio. Si no tiene dolor o [employer] espalda, haga estos ejercicios una vez por da o como se lo haya indicado el mdico. EJERCICIOS Rodilla al pecho Repita estos pasos 3 o 5veces con cada pierna:  Acustese boca arriba sobre una cama dura o sobre el suelo con las piernas extendidas.  Lleve una rodilla al pecho. La otra pierna debe quedar extendida y en contacto con el suelo.  Mantenga la rodilla contra el pecho. Para lograrlo tmese la rodilla o el muslo.  Tire de la rodilla hasta sentir una elongacin suave en la parte baja de la espalda.  Mantenga la elongacin durante 10 a 30segundos.  Suelte y extienda la pierna lentamente. Inclinacin de la pelvis Repita estos pasos 5 o 10veces:  Acustese boca arriba sobre una cama dura o sobre el suelo con las piernas extendidas.  Flexione las rodillas de modo que apunten al techo y los pies queden apoyados en el suelo.  Contraiga los msculos de la parte baja del abdomen para empujar la zona lumbar contra el suelo. Con este movimiento se inclinar la pelvis de modo que el cccix apunte hacia el techo, en lugar de apuntar en direccin a los pies o al suelo.  Contraiga suavemente y respire con normalidad mientras mantiene esta posicin durante 5 a 10segundos. El perro y el gato Repita estos pasos hasta que la zona lumbar se vuelva ms flexible:  [place] manos y las rodillas sobre una superficie firme. Las manos deben estar alineadas con los hombros  y las rodillas con las caderas. Puede colocarse almohadillas debajo de las rodillas para estar cmodo.  Deje caer la cabeza y baje el cccix en direccin al suelo de modo que la zona lumbar se arquee como el lomo de un [place].  Mantenga esta posicin durante 5segundos.  Lentamente, levante la cabeza y eleve el cccix de modo que apunte en direccin al techo para que la espalda forme un arco hundido como el lomo de un perro contento.  Mantenga esta posicin durante 5segundos. Flexiones de English as a second language teacher pasos 5 o 10veces:  Acustese boca abajo en el suelo.  Hasty manos cerca de la cabeza, separadas aproximadamente al ancho de los hombros.  Con la espalda lo ms relajada posible y las caderas apoyadas en el suelo, extienda lentamente los brazos para levantar la mitad superior del cuerpo y Community education officer los hombros. No use los msculos de la espalda para elevar la parte superior del torso. Puede cambiar las manos de lugar para estar ms cmodo.  Mantenga esta posicin durante 5segundos mientras mantiene la espalda relajada.  Lentamente vuelva a la posicin horizontal. Puentes Repita estos pasos 10veces:  Acustese boca arriba sobre una superficie firme.  [place]  modo que apunten al techo y los pies queden apoyados en el suelo.  Contraiga los glteos y despegue las nalgas del suelo hasta que la cintura est casi a la misma altura que las rodillas. Debe sentir el trabajo muscular en las nalgas y la parte posterior de los muslos. Si no siente el esfuerzo de [employer], aleje los pies 1 o 2pulgadas (2,5 o 5centmetros) de las nalgas.  Mantenga esta posicin durante 3 a 5segundos.  Baje lentamente las caderas a la posicin inicial y relaje los glteos por completo. Si este ejercicio le resulta muy fcil, intente realizarlo con los brazos cruzados [place]. Abdominales Repita estos pasos 5 o 10veces:  Acustese boca arriba sobre  una cama dura o sobre el suelo con las piernas extendidas.  Flexione las rodillas de modo que apunten al techo y los pies queden apoyados en el suelo.  Cruce los [employer].  Baje levemente el mentn en direccin al pecho sin doblar el cuello.  Contraiga los msculos del abdomen y con lentitud eleve el torso lo suficiente como para Administrator los omplatos del suelo. No eleve el torso ms alto que eso, porque esto puede sobreexigir a la zona lumbar y no ayuda a Lobbyist.  Regrese lentamente a la posicin inicial. Elevaciones de espalda Repita estos pasos 5 o 10veces: 1. Acustese boca abajo con los brazos a los costados del cuerpo y apoye la frente en el suelo. 2. Contraiga los msculos de las piernas y los glteos. 3. Lentamente despegue el pecho del suelo [employer] las caderas bien apoyadas en el suelo. Mantenga la nuca alineada con la curvatura de la espalda. Los ojos deben mirar al suelo. 4. Mantenga esta posicin durante 3 a 5segundos. 5. Regrese lentamente a la posicin inicial. SOLICITE ATENCIN MDICA SI:  El dolor o las molestias en la espalda se vuelven mucho ms intensos cuando hace un ejercicio.  El dolor o las molestias en la espalda no se Runner, broadcasting/film/video en el trmino de las 2horas posteriores a Therapist, art. Si tiene alguno de [employer], deje de [employer] ejercicios de inmediato. No vuelva a hacer los ejercicios a menos que el mdico lo autorice. SOLICITE ATENCIN MDICA DE INMEDIATO SI:  Siente un dolor sbito e intenso en la espalda. Si esto ocurre, deje de [employer] ejercicios de inmediato. No vuelva a hacer los ejercicios a menos que el mdico lo autorice.   Esta informacin no tiene Marine scientist el consejo del mdico. Asegrese de hacerle al mdico cualquier pregunta que tenga.   Document Released: 05/25/2005 Document Revised: 02/13/2015 Elsevier Interactive Patient Education 2016 [place] lesiones de la espalda (Back Injury Prevention) Las lesiones de la espalda pueden ser muy dolorosas. Adems son difciles de curar. Despus de haber tenido una lesin de la espalda, tiene ms probabilidad de lesionarse otra vez. Es importante que aprenda cmo evitar lesionarse o volver a Control and instrumentation engineer. Los siguientes consejos pueden ayudarlo a Product/process development scientist una lesin de la espalda. QU DEBO SABER SOBRE EL ESTADO FSICO?  Haga ejercicios durante 58mnutos diarios la mHartford Financialde la semana o como se lo haya indicado el mdico. AChief Strategy Officerde lo siguiente:  HSherilyn Cooterejercicios aerbicos, como caminar, trotar, andar en bicicleta o nadar.  Haga ejercicios que mejoren el equilibrio y la fuerza, cHamiltontai chi y el yoga. Estos pueden reducir el riesgo de que se caiga y se lesione la espalda.  Haga ejercicios de elongacin para ayudar a la flexibilidad.  Intente desarrollar msculos abdominales fuertes. Los msculos del abdomen brindan gran parte del sostn que la espalda necesita.  Mantenga un peso saludable. Esto ayuda a reducir Catering manager de sufrir una lesin de la espalda. QU DEBO SABER SOBRE LA [place]?  Hable con el mdico sobre la dieta en general. [place] vitaminas y los suplementos solamente como se lo haya indicado el mdico.  Hable con el mdico sobre la cantidad de calcio y vitaminaD que necesita a diario. Estos nutrientes ayudan a Insurance risk surveyor de los huesos (osteoporosis). La osteoporosis puede derivar en fracturas seas, lo que puede causar dolor de espalda.  Incluya buenas fuentes de calcio en la dieta, como productos lcteos, verduras de hojas verdes y productos con agregado de calcio (fortificados).  Incluya buenas fuentes de vitaminaD en la dieta, como leche y alimentos fortificados con esta vitamina. [place]?  Sintese y prese erguido. No se incline hacia adelante al sentarse ni se encorve al  pararse.  Elija las sillas con un buen apoyo para la zona baja de la espalda (lumbar).  Si trabaja en un escritorio, sintese cerca de este para no tener que inclinarse. Mantenga el mentn hacia abajo. Mantenga el cuello hacia atrs y los codos flexionados en ngulo recto. La posicin de los brazos debe verse como la letra "L".  Cuando conduzca, sintese elevado y cerca del volante. Agregue un apoyo para la zona lumbar al asiento del automvil, si es necesario.  No permanezca sentado ni parado en una posicin durante [employer]. Tmese descansos para pararse, estirarse y caminar, al menos una vez por hora. Tmese descansos cada hora si conduce durante perodos largos de tiempo.  Duerma de costado con las rodillas apenas dobladas o boca arriba con una almohada debajo de las rodillas. No duerma boca abajo. [place], SOBRE LOS MOVIMIENTOS DE TORSIN Y LOS DE ESTIRAMIENTO Levantamiento de objetos y de cargas pesadas  No levante cargas pesadas y evite especialmente el levantamiento repetitivo de objetos pesados. Si debe levantar cargas pesadas:  Elongue antes de hacerlo.  Trabaje lentamente.  Descanse despus de cada levantamiento.  Use una herramienta, como un carrito o una plataforma mvil para mover los objetos, si hay una disponible.  Haga varios viajes cortos, en lugar de llevar una carga pesada.  Pida ayuda cuando la necesite, en especial cuando mueva objetos de gran tamao.  Siga estos pasos cuando levante objetos:  Prese con los pies separados al ancho de los hombros.  Acrquese al objeto tanto como pueda. No intente levantar un objeto pesado que est lejos de su cuerpo.  Use agarraderas o correas de elevacin si estn disponibles.  [place]. [place]chese, pero mantenga los [employer].  Mantenga los hombros [place] atrs, el mentn hacia abajo y la espalda derecha.  Levante lentamente el objeto mientras contrae los  msculos de las piernas, el abdomen y los glteos. Mantenga el objeto tan cerca del centro del cuerpo como sea posible.  Siga estos pasos cuando baje una carga pesada:  Prese con los pies separados al ancho de los hombros.  Baje lentamente el objeto mientras contrae los msculos de las piernas, el abdomen y los glteos. Mantenga el objeto tan cerca del centro del cuerpo como sea posible.  Mantenga los hombros [place] atrs, el mentn hacia abajo y la espalda derecha.  [place]. [place], pero mantenga los talones en  el suelo.  Use agarraderas o correas de elevacin si estn disponibles. Movimientos de torsin y de estiramiento  No levante objetos pesados por encima del nivel de la cintura.  No tuerza la cintura mientras levanta o transporta una carga. Si tiene que girar, [employer].  No se incline sin flexionar las rodillas.  No se estire para alcanzar un objeto que est por encima de su cabeza, al otro lado de una mesa o sobre una superficie elevada. [place]?  Evite los pisos mojados y los suelos helados. Retire el hielo de las aceras para evitar las cadas.  No duerma sobre un colchn muy blando ni muy duro.  Mantenga los objetos que [place] a menudo en lugares de fcil acceso.  Coloque los objetos ms pesados en estantes a nivel de la cintura y los ms livianos en estantes ms bajos o ms altos.  Encuentre formas de reducir Dealer, como hacer ejercicios, darse masajes o aplicar tcnicas de relajacin. El estrs puede [employer]. Los msculos en estado de tensin son ms propensos a las lesiones.  Hable con el mdico si se siente ansioso o deprimido. Estas afecciones pueden intensificar el dolor de espalda.  Use calzado sin tacones con suelas acolchonadas.  Evite los movimientos repentinos.  Use ambas correas de sujecin cuando cargue una mochila.  No consuma ningn producto que contenga tabaco, lo que incluye  cigarrillos, tabaco de Higher education careers adviser o Psychologist, sport and exercise. Si necesita ayuda para dejar de fumar, consulte al mdico.   Esta informacin no tiene Marine scientist el consejo del mdico. Asegrese de hacerle al mdico cualquier pregunta que tenga.   Document Released: 05/25/2005 Document Revised: 10/09/2014 Elsevier Interactive Patient Education [employer].

## 2015-07-10 ENCOUNTER — Emergency Department (INDEPENDENT_AMBULATORY_CARE_PROVIDER_SITE_OTHER)
Admission: EM | Admit: 2015-07-10 | Discharge: 2015-07-10 | Disposition: A | Discharge status: Home / Self Care | Payer: Self-pay | Source: Home / Self Care | Attending: Family Medicine | Admitting: Family Medicine

## 2015-07-10 ENCOUNTER — Other Ambulatory Visit (HOSPITAL_COMMUNITY)
Admission: RE | Admit: 2015-07-10 | Discharge: 2015-07-10 | Disposition: A | Discharge status: Home / Self Care | Payer: Self-pay | Source: Ambulatory Visit | Attending: Family Medicine | Admitting: Family Medicine

## 2015-07-10 ENCOUNTER — Encounter (HOSPITAL_COMMUNITY): Payer: Self-pay | Admitting: *Deleted

## 2015-07-10 DIAGNOSIS — J069 Acute upper respiratory infection, unspecified: Secondary | ICD-10-CM | POA: Insufficient documentation

## 2015-07-10 LAB — POCT RAPID STREP A: Streptococcus, Group A Screen (Direct): NEGATIVE

## 2015-07-10 MED ORDER — IPRATROPIUM BROMIDE 0.06 % NA SOLN: 2.0000 | Freq: Four times a day (QID) | NASAL | Status: DC

## 2015-07-10 NOTE — Discharge Instructions (Signed)
Drink lots of fluids, take all of medicine, use lozenges as needed.return if needed °

## 2015-07-10 NOTE — ED Notes (Signed)
Pt  Reports   Symptoms   Of  Cough   -   sorethroat   Congested     Fever      With    Symptoms    X 4-5  Days    Pt  Appears  In no  Acute  Severe  Distress          Sitting upright on the  Exam table

## 2015-07-10 NOTE — ED Provider Notes (Signed)
CSN: [id number]     Arrival date & time 07/10/15  1323 History   None    Chief Complaint  Patient presents with  . Cough   (Consider location/radiation/quality/duration/timing/severity/associated sxs/prior Treatment) Patient is a 35 y.o. male presenting with cough. The history is provided by the patient.  Cough Cough characteristics:  Non-productive Severity:  Mild Onset quality:  Gradual Duration:  4 days Timing:  Constant Progression:  Unchanged Chronicity:  New Smoker: no   Context: upper respiratory infection   Relieved by:  None tried Worsened by:  Nothing tried Ineffective treatments:  None tried Associated symptoms: rhinorrhea and sore throat   Associated symptoms: no fever     History reviewed. No pertinent past medical history. History reviewed. No pertinent past surgical history. History reviewed. No pertinent family history. Social History  Substance Use Topics  . Smoking status: Never Smoker   . Smokeless tobacco: None  . Alcohol Use: No    Review of Systems  Constitutional: Negative.  Negative for fever.  HENT: Positive for congestion, postnasal drip, rhinorrhea and sore throat.   Respiratory: Positive for cough.   Cardiovascular: Negative.   All other systems reviewed and are negative.   Allergies  Review of patient's allergies indicates no known allergies.  Home Medications   Prior to Admission medications   Medication Sig Start Date End Date Taking? Authorizing Provider  HYDROcodone-acetaminophen (NORCO/VICODIN) 5-325 MG tablet Take 2 tablets by mouth every 4 (four) hours as needed. 05/17/15   Fransico Meadow, PA-C  ipratropium (ATROVENT) 0.06 % nasal spray Place 2 sprays into both nostrils 4 (four) times daily. 07/10/15   Billy Fischer, MD  methocarbamol (ROBAXIN) 500 MG tablet Take 1 tablet (500 mg total) by mouth 2 (two) times daily. 05/17/15   Fransico Meadow, PA-C  methocarbamol (ROBAXIN) 500 MG tablet Take 1 tablet (500 mg total) by mouth 2 (two)  times daily. 05/17/15   Fransico Meadow, PA-C  naproxen (NAPROSYN) 500 MG tablet Take 1 tablet (500 mg total) by mouth 2 (two) times daily with a meal. 05/23/15   Margarita Mail, PA-C  terbinafine (LAMISIL) 250 MG tablet Take 250 mg by mouth daily. For 3 months     Historical Provider, MD   Meds Ordered and Administered this Visit  Medications - No data to display  BP 140/86 mmHg  Pulse 72  Temp(Src) 98.8 F (37.1 C) (Oral)  Resp 16  SpO2 99% No data found.   Physical Exam  Constitutional: He is oriented to person, place, and time. He appears well-developed and well-nourished. No distress.  HENT:  Right Ear: External ear normal.  Left Ear: External ear normal.  Mouth/Throat: Oropharynx is clear and moist.  Neck: Normal range of motion. Neck supple.  Cardiovascular: Normal heart sounds.   Pulmonary/Chest: Breath sounds normal.  Lymphadenopathy:    He has no cervical adenopathy.  Neurological: He is alert and oriented to person, place, and time.  Skin: Skin is warm and dry.  Nursing note and vitals reviewed.   ED Course  Procedures (including critical care time)  Labs Review Labs Reviewed  POCT RAPID STREP A   Strep neg Imaging Review No results found.   Visual Acuity Review  Right Eye Distance:   Left Eye Distance:   Bilateral Distance:    Right Eye Near:   Left Eye Near:    Bilateral Near:         MDM   1. URI (upper respiratory infection)  Meds ordered this encounter  Medications  . ipratropium (ATROVENT) 0.06 % nasal spray    Sig: Place 2 sprays into both nostrils 4 (four) times daily.    Dispense:  15 mL    Refill:  1       Billy Fischer, MD 07/10/15 [phone]

## 2015-07-13 LAB — CULTURE, GROUP A STREP (THRC)

## 2015-08-29 ENCOUNTER — Ambulatory Visit: Payer: Self-pay | Admitting: Orthopedic Surgery

## 2015-08-30 ENCOUNTER — Encounter (HOSPITAL_COMMUNITY): Payer: Self-pay

## 2015-08-30 ENCOUNTER — Encounter (HOSPITAL_COMMUNITY)
Admission: RE | Admit: 2015-08-30 | Discharge: 2015-08-30 | Disposition: A | Discharge status: Home / Self Care | Payer: Worker's Compensation | Source: Ambulatory Visit | Attending: Specialist | Admitting: Specialist

## 2015-08-30 ENCOUNTER — Encounter (HOSPITAL_COMMUNITY): Payer: Self-pay | Admitting: *Deleted

## 2015-08-30 ENCOUNTER — Ambulatory Visit (HOSPITAL_COMMUNITY)
Admission: RE | Admit: 2015-08-30 | Discharge: 2015-08-30 | Disposition: A | Discharge status: Home / Self Care | Payer: Worker's Compensation | Source: Ambulatory Visit | Attending: Orthopedic Surgery | Admitting: Orthopedic Surgery

## 2015-08-30 DIAGNOSIS — M5126 Other intervertebral disc displacement, lumbar region: Secondary | ICD-10-CM | POA: Diagnosis present

## 2015-08-30 HISTORY — DX: Other intervertebral disc displacement, lumbosacral region: M51.27

## 2015-08-30 LAB — CBC
HEMATOCRIT: 45.5 % (ref 39.0–52.0)
HEMOGLOBIN: 15.2 g/dL (ref 13.0–17.0)
MCH: 30.4 pg (ref 26.0–34.0)
MCHC: 33.4 g/dL (ref 30.0–36.0)
MCV: 91 fL (ref 78.0–100.0)
Platelets: 279 10*3/uL (ref 150–400)
RBC: 5 MIL/uL (ref 4.22–5.81)
RDW: 13.3 % (ref 11.5–15.5)
WBC: 7.1 10*3/uL (ref 4.0–10.5)

## 2015-08-30 LAB — BASIC METABOLIC PANEL
ANION GAP: 9 (ref 5–15)
BUN: 10 mg/dL (ref 6–20)
CO2: 25 mmol/L (ref 22–32)
Calcium: 9.3 mg/dL (ref 8.9–10.3)
Chloride: 106 mmol/L (ref 101–111)
Creatinine, Ser: 0.86 mg/dL (ref 0.61–1.24)
GFR calc Af Amer: >60 mL/min (ref >60)
GLUCOSE: 90 mg/dL (ref 65–99)
POTASSIUM: 4.9 mmol/L (ref 3.5–5.1)
Sodium: 140 mmol/L (ref 135–145)

## 2015-08-30 LAB — SURGICAL PCR SCREEN
MRSA, PCR: NEGATIVE
Staphylococcus aureus: NEGATIVE

## 2015-08-30 LAB — TYPE AND SCREEN
ABO/RH(D): O POS
ANTIBODY SCREEN: NEGATIVE

## 2015-08-30 NOTE — Pre-Procedure Instructions (Signed)
Video Spanish Interpreter "Claudia" # [id number] used. Back X-ray per MD order.

## 2015-08-30 NOTE — Patient Instructions (Signed)
Wayne Miller  08/30/2015   Your procedure is scheduled on: 09-11-15  Report to [facility]  Entrance take Wayne Miller  elevators to 3rd floor to  Wayne Miller at  [place] AM.  Call this number if you have problems the morning of surgery [phone]   Remember: ONLY 1 PERSON MAY GO WITH YOU TO SHORT STAY TO GET  READY MORNING OF [place].  Do not eat food or drink liquids :After Midnight.     Take these medicines the morning of surgery with A SIP OF WATER: Tylenol if need. DO NOT TAKE ANY DIABETIC MEDICATIONS DAY OF YOUR SURGERY                               You may not have any metal on your body including hair pins and              piercings  Do not wear jewelry, make-up, lotions, powders or perfumes, deodorant             Do not wear nail polish.  Do not shave  48 hours prior to surgery.              Men may shave face and neck.   Do not bring valuables to the [facility]. [place].  Contacts, dentures or bridgework may not be worn into surgery.  Leave suitcase in the car. After surgery it may be brought to your room.     Patients discharged the day of surgery will not be allowed to drive home.  Name and phone number of your driver: Wayne Miller [phone]- [phone]  Speaks English  Special Instructions: N/A              Please read over the following fact sheets you were given: _____________________________________________________________________             [facility] - Preparing for Surgery Before surgery, you can play an important role.  Because skin is not sterile, your skin needs to be as free of germs as possible.  You can reduce the number of germs on your skin by washing with CHG (chlorahexidine gluconate) soap before surgery.  CHG is an antiseptic cleaner which kills germs and bonds with the skin to continue killing germs even after washing. Please DO NOT use if you have an allergy to CHG or  antibacterial soaps.  If your skin becomes reddened/irritated stop using the CHG and inform your nurse when you arrive at Short Stay. Do not shave (including legs and underarms) for at least 48 hours prior to the first CHG shower.  You may shave your face/neck. Please follow these instructions carefully:  1.  Shower with CHG Soap the night before surgery and the  morning of Surgery.  2.  If you choose to wash your hair, wash your hair first as usual with your  normal  shampoo.  3.  After you shampoo, rinse your hair and body thoroughly to remove the  shampoo.                           4.  Use CHG as you would any other liquid soap.  You can apply chg  directly  to the skin and wash                       Gently with a scrungie or clean washcloth.  5.  Apply the CHG Soap to your body ONLY FROM THE NECK DOWN.   Do not use on face/ open                           Wound or open sores. Avoid contact with eyes, ears mouth and genitals (private parts).                       Wash face,  Genitals (private parts) with your normal soap.             6.  Wash thoroughly, paying special attention to the area where your surgery  will be performed.  7.  Thoroughly rinse your body with warm water from the neck down.  8.  DO NOT shower/wash with your normal soap after using and rinsing off  the CHG Soap.                9.  Pat yourself dry with a clean towel.            10.  Wear clean pajamas.            11.  Place clean sheets on your bed the night of your first shower and do not  sleep with pets. Day of Surgery : Do not apply any lotions/deodorants the morning of surgery.  Please wear clean clothes to the [facility]/surgery center.  FAILURE TO FOLLOW THESE INSTRUCTIONS MAY RESULT IN THE CANCELLATION OF YOUR SURGERY PATIENT SIGNATURE_________________________________  NURSE SIGNATURE__________________________________  ________________________________________________________________________   [name]  An incentive spirometer is a tool that can help keep your lungs clear and active. This tool measures how well you are filling your lungs with each breath. Taking long deep breaths may help reverse or decrease the chance of developing breathing (pulmonary) problems (especially infection) following:  A long period of time when you are unable to move or be active. BEFORE THE PROCEDURE   If the spirometer includes an indicator to show your best effort, your nurse or respiratory therapist will set it to a desired goal.  If possible, sit up straight or lean slightly forward. Try not to slouch.  Hold the incentive spirometer in an upright position. INSTRUCTIONS FOR USE   Sit on the edge of your bed if possible, or sit up as far as you can in bed or on a chair.  Hold the incentive spirometer in an upright position.  Breathe out normally.  Place the mouthpiece in your mouth and seal your lips tightly around it.  Breathe in slowly and as deeply as possible, raising the piston or the ball toward the top of the column.  Hold your breath for 3-5 seconds or for as long as possible. Allow the piston or ball to fall to the bottom of the column.  Remove the mouthpiece from your mouth and breathe out normally.  Rest for a few seconds and repeat Steps 1 through 7 at least 10 times every 1-2 hours when you are awake. Take your time and take a few normal breaths between deep breaths.  The spirometer may include an indicator to show your best effort. Use the indicator as a goal to work toward during each repetition.  After each set of 10 deep breaths, practice coughing to be sure your lungs are clear. If you have an incision (the cut made at the time of surgery), support your incision when coughing by placing a pillow or rolled up towels firmly against it. Once you are able to get out of bed, walk around indoors and cough well. You may stop using the incentive spirometer when instructed by  your caregiver.  RISKS AND COMPLICATIONS  Take your time so you do not get dizzy or light-headed.  If you are in pain, you may need to take or ask for pain medication before doing incentive spirometry. It is harder to take a deep breath if you are having pain. AFTER USE  Rest and breathe slowly and easily.  It can be helpful to keep track of a log of your progress. Your caregiver can provide you with a simple table to help with this. If you are using the spirometer at home, follow these instructions: [place] IF:   You are having difficultly using the spirometer.  You have trouble using the spirometer as often as instructed.  Your pain medication is not giving enough relief while using the spirometer.  You develop fever of 100.5 F (38.1 C) or higher. SEEK IMMEDIATE MEDICAL CARE IF:   You cough up bloody sputum that had not been present before.  You develop fever of 102 F (38.9 C) or greater.  You develop worsening pain at or near the incision site. MAKE SURE YOU:   Understand these instructions.  Will watch your condition.  Will get help right away if you are not doing well or get worse. Document Released: 10/05/2006 Document Revised: 08/17/2011 Document Reviewed: 12/06/2006 Exit[employer] Patient Information 2014 [employer], [place].   ________________________________________________________________________  WHAT IS A BLOOD TRANSFUSION? Blood Transfusion Information  A transfusion is the replacement of blood or some of its parts. Blood is made up of multiple cells which provide different functions.  Red blood cells carry oxygen and are used for blood loss replacement.  White blood cells fight against infection.  Platelets control bleeding.  Plasma helps clot blood.  Other blood products are available for specialized needs, such as hemophilia or other clotting disorders. BEFORE THE TRANSFUSION  Who gives blood for transfusions?   Healthy volunteers who are  fully evaluated to make sure their blood is safe. This is blood bank blood. Transfusion therapy is the safest it has ever been in the practice of medicine. Before blood is taken from a donor, a complete history is taken to make sure that person has no history of diseases nor engages in risky social behavior (examples are intravenous drug use or sexual activity with multiple partners). The donor's travel history is screened to minimize risk of transmitting infections, such as malaria. The donated blood is tested for signs of infectious diseases, such as HIV and hepatitis. The blood is then tested to be sure it is compatible with you in order to minimize the chance of a transfusion reaction. If you or a relative donates blood, this is often done in anticipation of surgery and is not appropriate for emergency situations. It takes many days to process the donated blood. RISKS AND COMPLICATIONS Although transfusion therapy is very safe and saves many lives, the main dangers of transfusion include:   Getting an infectious disease.  Developing a transfusion reaction. This is an allergic reaction to something in the blood you were given. Every precaution is taken to prevent this. The decision  to have a blood transfusion has been considered carefully by your caregiver before blood is given. Blood is not given unless the benefits outweigh the risks. AFTER THE TRANSFUSION  Right after receiving a blood transfusion, you will usually feel much better and more energetic. This is especially true if your red blood cells have gotten low (anemic). The transfusion raises the level of the red blood cells which carry oxygen, and this usually causes an energy increase.  The nurse administering the transfusion will monitor you carefully for complications. HOME CARE INSTRUCTIONS  No special instructions are needed after a transfusion. You may find your energy is better. Speak with your caregiver about any limitations on  activity for underlying diseases you may have. SEEK MEDICAL CARE IF:   Your condition is not improving after your transfusion.  You develop redness or irritation at the intravenous (IV) site. SEEK IMMEDIATE MEDICAL CARE IF:  Any of the following symptoms occur over the next 12 hours:  Shaking chills.  You have a temperature by mouth above 102 F (38.9 C), not controlled by medicine.  Chest, back, or muscle pain.  People around you feel you are not acting correctly or are confused.  Shortness of breath or difficulty breathing.  Dizziness and fainting.  You get a rash or develop hives.  You have a decrease in urine output.  Your urine turns a dark color or changes to pink, red, or brown. Any of the following symptoms occur over the next 10 days:  You have a temperature by mouth above 102 F (38.9 C), not controlled by medicine.  Shortness of breath.  Weakness after normal activity.  The white part of the eye turns yellow (jaundice).  You have a decrease in the amount of urine or are urinating less often.  Your urine turns a dark color or changes to pink, red, or brown. Document Released: 05/22/2000 Document Revised: 08/17/2011 Document Reviewed: 01/09/2008 [facility] Patient Information 2014 [place], [place].  _______________________________________________________________________

## 2015-09-04 ENCOUNTER — Ambulatory Visit: Payer: Self-pay | Admitting: Orthopedic Surgery

## 2015-09-04 NOTE — H&P (Signed)
Wayne Miller is an 35 y.o. male.   Chief Complaint: Right lower extremity radicular pain. HPI: The patient is a 35 year old male who presents with back pain. The patient is here today for a surgical consult and in referral from Dr. Nelva Bush. The patient reports low back symptoms which began 13 week(s) (6 days) ago following a specific injury. The injury occurred 05/16/2015 at work due to lifting and bending. Symptoms are reported to be located in the low back and Symptoms include pain. The pain radiates to the right buttock, right posterior thigh and right posterior lower leg. Symptoms are exacerbated by standing. Current treatment includes nonsteroidal anti-inflammatory drugs, muscle relaxants and activity modification. Past evaluation has included x-ray of the lumbar spine and MRI of the lumbar spine. Past treatment has included nonsteroidal anti-inflammatory drugs, opioid analgesics, muscle relaxants, activity modification and physical therapy. 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 Note for "Back pain": The patient currently has light duty restrictions of no lifting over 10 lbs, no forceful twisting, pushing, pulling greater than 10 lbs, no bending, stooping, squatting greater than 5 times per hour and no going up stairs more than one time per hour.  This is a 35 year old male who is kindly referred by Dr. Suella Broad for evaluation of above-mentioned symptoms. The patient reports a work-related injury back in early December 2016. He was lifting a 128-pound object at work. He had acute pain in his back and into his legs. seen in the emergency room, referred to Dr. Nelva Bush, diagnosed him with acute lumbar strain with radiculopathy. He underwent physical therapy and due to the persistence of his symptoms, the patient required MRI. He had persistently elevated pain. No helped from narcotic analgesics. The MRI indicated a disc herniation small at L4-5 to the right with displacing the L5  root. At L5-S1, there was severe neuroforaminal stenosis on the right, moderate on the left with associated disc protrusion. The patient reports radiating pain in the lateral aspect of the thigh and to the calf, worse with standing and better with sitting. Worsening from his physical therapy. Difficult to perform his activities of daily living. He presents here for a surgical discussion. Dr. Nelva Bush has discussed activity modification, analgesics, and injections. He thought he was a candidate for an injection at L4-5. He is here with his translator as well. The patient's pain drawings are organic. He rates his pain in the L5-S1 nerve root distribution.  Past Medical History  Diagnosis Date  . Herniated nucleus pulposis of lumbosacral region     Past Surgical History  Procedure Laterality Date  . Knee arthroscopy Left     left knee scope "tear repair"    No family history on file. Social History:  reports that he quit smoking about 2 years ago. His smoking use included Cigarettes. He has never used smokeless tobacco. He reports that he does not drink alcohol or use illicit drugs.  Allergies: No Known Allergies   (Not in a hospital admission)  No results found for this or any previous visit (from the past 48 hour(s)). No results found.  Review of Systems  Constitutional: Negative.   HENT: Negative.   Eyes: Negative.   Respiratory: Negative.   Cardiovascular: Negative.   Gastrointestinal: Negative.   Genitourinary: Negative.   Musculoskeletal: Positive for back pain.  Skin: Negative.   Neurological: Positive for sensory change and focal weakness.    There were no vitals taken for this visit. Physical Exam  Constitutional: He is oriented to person, place, and time. He appears well-developed and well-nourished. He appears distressed.  HENT:  Head: Normocephalic.  Eyes: Pupils are equal, round, and reactive to light.  Neck: Normal range of motion.  Cardiovascular: Normal rate.    Respiratory: Effort normal.  GI: Soft.  Musculoskeletal:  On exam, he is upright, in moderate distress. Mood and affect is appropriate. Straight leg raise is buttock, thigh, and calf pain on the right, negative on the left. He has pain with extension, relieved with forward flexion. Trace EHL weakness on the right compared to the left. Decreased Achilles reflex on the right compared to the left.  Lumbar spine exam reveals no evidence of soft tissue swelling, deformity or skin ecchymosis. On palpation there is no tenderness of the lumbar spine. No flank pain with percussion. The abdomen is soft and nontender. Nontender over the trochanters. No cellulitis or lymphadenopathy.  Motor is 5/5 including tibialis anterior, plantar flexion, quadriceps and hamstrings. Patient is normoreflexic. There is no Babinski or clonus. Sensory exam is intact to light touch. Patient has good distal pulses. No DVT. No pain and normal range of motion without instability of the hips, knees and ankles.  Neurological: He is alert and oriented to person, place, and time.    AP, lateral, flexion, and extension radiographs of the lumbar spine demonstrate some disc space narrowing at L5-S1 with neuroforaminal stenosis at L5 on the right with an increased lumbosacral angle.  MRI dated 07/12/2015 demonstrates the paracentral disc herniation at L4-5 with slight displacement of the L5 root and severe neuroforaminal stenosis at L5-S1 multifactorial. Compression of the L5 root noted. Superior articulating process of S1 as well as a disc protrusion and increased lumbosacral angle.  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  I  had an extensive discussion with Wayne Miller concerning his current pathology, relevant anatomy, and treatment options. I do feel it is reasonable to proceed with an L4-5 epidural or selective nerve root block at L5 for therapeutic purposes. Again, I do feel that the L5 root is potentially affected at the L4-5 and at L5-S1, although more probably at L5-S1 out into the foramen. The parasagittal images at L5-S1 demonstrate significant stenosis of the L5 root into the foramen secondary to superior articulating process of S1 as well as a disc protrusion. After considerable discussions and interpretations with his pathology, relevant anatomy, and utilization of models, the patient indicates that he is not interested in any injection therapy. At this point, I explained the potential therapeutic benefit from that. He continues with 10/10 pain. He does have an aversion to needles. Certainly another option is a decompression at L4-5. I would, however, continue to trace the L5 nerve root with a foraminotomy and evaluation of the space at L5-S1 as the compression into the foramen of L5-S1 I feel is significant. Evaluation of the disc at L4-5, again a small disc herniation of L4-5, I think if that was the only pathology here may be either cured him with conservative treatment or amenable with epidural. Again, I reviewed his chart in detail. I spent considerable time discussing these issues. We discussed then the procedure, a decompression at L4-5 and L5-S1 and foraminotomy at L5.  I had an extensive discussion of the risks and  benefits of the lumbar decompression with the patient including bleeding, infection, damage to neurovascular structures, epidural fibrosis, CSF leak requiring repair. We also discussed increase in pain, adjacent segment disease, recurrent disc herniation, need for future surgery including repeat decompression and/or fusion. We also discussed risks of postoperative hematoma, paralysis, anesthetic  complications including DVT, PE, death, cardiopulmonary dysfunction. In addition, the perioperative and postoperative courses were discussed in detail including the rehabilitative time and return to functional activity and work. I provided the patient with an illustrated handout and utilized the appropriate surgical models.  His other options are again to live with the symptoms the way they are. He reports he is not willing to do so. He rates his pain as 10/10. Continued pain management injections versus a decompression, I do feel at least his disc protrusion his neural compression is secondary to the mechanism of injury described. I then indicate most likely with that mechanism a myofascial injury with residual myofascial pain. Overnight in the hospital, two weeks suture removal, six weeks of formal supervised physical therapy, light duty at six weeks, potential work conditioning, and maximum medical improvement at 12 weeks postoperatively. He is otherwise healthy. He is currently not a smoker. No chest pain or shortness of breath. If he changes his mind in terms of the injection, we discussed L5 nerve root block or epidural at L4-5. I appreciate the opportunity to discuss all these issues.  Plan microlumbar decompression L4-5, L5-S1 right, foraminotomy L5 right  Cecilie Kicks., PA-C for Dr. Tonita Cong 09/04/2015, 3:36 PM

## 2015-09-10 NOTE — Anesthesia Preprocedure Evaluation (Addendum)
Anesthesia Evaluation  Patient identified by MRN, date of birth, ID band Patient awake    Reviewed: Allergy & Precautions, NPO status , Patient's Chart, lab work & pertinent test results  Airway Mallampati: II   Neck ROM: Full    Dental  (+) Dental Advisory Given, Teeth Intact   Pulmonary neg pulmonary ROS, former smoker (quit 2015),    breath sounds clear to auscultation       Cardiovascular negative cardio ROS   Rhythm:Regular     Neuro/Psych negative neurological ROS  negative psych ROS   GI/Hepatic negative GI ROS, Neg liver ROS,   Endo/Other  negative endocrine ROSOverweight BMI 32  Renal/GU negative Renal ROS  negative genitourinary   Musculoskeletal negative musculoskeletal ROS (+)   Abdominal   Peds negative pediatric ROS (+)  Hematology negative hematology ROS (+)   Anesthesia Other Findings   Reproductive/Obstetrics negative OB ROS                            Anesthesia Physical Anesthesia Plan  ASA: II  Anesthesia Plan: General   Post-op Pain Management:    Induction: Intravenous  Airway Management Planned: Oral ETT  Additional Equipment:   Intra-op Plan:   Post-operative Plan: Extubation in OR  Informed Consent: I have reviewed the patients History and Physical, chart, labs and discussed the procedure including the risks, benefits and alternatives for the proposed anesthesia with the patient or authorized representative who has indicated his/her understanding and acceptance.     Plan Discussed with:   Anesthesia Plan Comments:         Anesthesia Quick Evaluation

## 2015-09-11 ENCOUNTER — Ambulatory Visit (HOSPITAL_COMMUNITY): Payer: Worker's Compensation

## 2015-09-11 ENCOUNTER — Ambulatory Visit (HOSPITAL_COMMUNITY): Payer: Worker's Compensation | Admitting: Anesthesiology

## 2015-09-11 ENCOUNTER — Ambulatory Visit (HOSPITAL_COMMUNITY)
Admission: RE | Admit: 2015-09-11 | Discharge: 2015-09-12 | Disposition: A | Discharge status: Home / Self Care | Payer: Worker's Compensation | Source: Ambulatory Visit | Attending: Specialist | Admitting: Specialist

## 2015-09-11 ENCOUNTER — Encounter (HOSPITAL_COMMUNITY)
Admission: RE | Disposition: A | Discharge status: Home / Self Care | Payer: Self-pay | Source: Ambulatory Visit | Attending: Specialist

## 2015-09-11 ENCOUNTER — Encounter (HOSPITAL_COMMUNITY): Payer: Self-pay | Admitting: *Deleted

## 2015-09-11 DIAGNOSIS — M4806 Spinal stenosis, lumbar region: Secondary | ICD-10-CM | POA: Insufficient documentation

## 2015-09-11 DIAGNOSIS — E663 Overweight: Secondary | ICD-10-CM | POA: Insufficient documentation

## 2015-09-11 DIAGNOSIS — M48061 Spinal stenosis, lumbar region without neurogenic claudication: Secondary | ICD-10-CM | POA: Diagnosis present

## 2015-09-11 DIAGNOSIS — Z419 Encounter for procedure for purposes other than remedying health state, unspecified: Secondary | ICD-10-CM

## 2015-09-11 DIAGNOSIS — Z87891 Personal history of nicotine dependence: Secondary | ICD-10-CM | POA: Diagnosis not present

## 2015-09-11 DIAGNOSIS — Z6832 Body mass index (BMI) 32.0-32.9, adult: Secondary | ICD-10-CM | POA: Insufficient documentation

## 2015-09-11 DIAGNOSIS — M5416 Radiculopathy, lumbar region: Secondary | ICD-10-CM | POA: Insufficient documentation

## 2015-09-11 HISTORY — PX: LUMBAR LAMINECTOMY/DECOMPRESSION MICRODISCECTOMY: SHX5026

## 2015-09-11 SURGERY — LUMBAR LAMINECTOMY/DECOMPRESSION MICRODISCECTOMY 2 LEVELS
Anesthesia: General | Site: Back | Laterality: Right

## 2015-09-11 MED ORDER — ALUM & MAG HYDROXIDE-SIMETH 200-200-20 MG/5ML PO SUSP: 30.0000 mL | Freq: Four times a day (QID) | ORAL | Status: DC | PRN

## 2015-09-11 MED ORDER — MIDAZOLAM HCL 5 MG/5ML IJ SOLN
INTRAMUSCULAR | Status: DC | PRN
  Administered 2015-09-11: 2 mg via INTRAVENOUS

## 2015-09-11 MED ORDER — CEFAZOLIN SODIUM-DEXTROSE 2-4 GM/100ML-% IV SOLN
2.0000 g | INTRAVENOUS | Status: AC
  Administered 2015-09-11: 2 g via INTRAVENOUS

## 2015-09-11 MED ORDER — SUGAMMADEX SODIUM 200 MG/2ML IV SOLN
INTRAVENOUS | Status: DC | PRN
  Administered 2015-09-11: 200 mg via INTRAVENOUS

## 2015-09-11 MED ORDER — HYDROCODONE-ACETAMINOPHEN 5-325 MG PO TABS
1.0000 | ORAL_TABLET | ORAL | Status: DC | PRN
  Administered 2015-09-11 – 2015-09-12 (×2): 2 via ORAL
  Filled 2015-09-11 (×2): qty 2

## 2015-09-11 MED ORDER — IPRATROPIUM BROMIDE 0.06 % NA SOLN
2.0000 | Freq: Four times a day (QID) | NASAL | Status: DC
  Administered 2015-09-11 – 2015-09-12 (×3): 2 via NASAL
  Filled 2015-09-11: qty 15

## 2015-09-11 MED ORDER — ROCURONIUM BROMIDE 100 MG/10ML IV SOLN
INTRAVENOUS | Status: DC | PRN
  Administered 2015-09-11: 50 mg via INTRAVENOUS
  Administered 2015-09-11: 10 mg via INTRAVENOUS

## 2015-09-11 MED ORDER — TERBINAFINE HCL 250 MG PO TABS
250.0000 mg | ORAL_TABLET | Freq: Every day | ORAL | Status: DC
  Administered 2015-09-11 – 2015-09-12 (×2): 250 mg via ORAL
  Filled 2015-09-11 (×2): qty 1

## 2015-09-11 MED ORDER — ACETAMINOPHEN 325 MG PO TABS: 650.0000 mg | ORAL_TABLET | ORAL | Status: DC | PRN

## 2015-09-11 MED ORDER — MENTHOL 3 MG MT LOZG: 1.0000 | LOZENGE | OROMUCOSAL | Status: DC | PRN

## 2015-09-11 MED ORDER — PHENOL 1.4 % MT LIQD: 1.0000 | OROMUCOSAL | Status: DC | PRN

## 2015-09-11 MED ORDER — MAGNESIUM CITRATE PO SOLN: 1.0000 | Freq: Once | ORAL | Status: DC | PRN

## 2015-09-11 MED ORDER — LACTATED RINGERS IV SOLN
INTRAVENOUS | Status: DC
  Administered 2015-09-11 (×2): via INTRAVENOUS

## 2015-09-11 MED ORDER — MEPERIDINE HCL 50 MG/ML IJ SOLN: 6.2500 mg | INTRAMUSCULAR | Status: DC | PRN

## 2015-09-11 MED ORDER — HYDROMORPHONE HCL 1 MG/ML IJ SOLN
0.2500 mg | INTRAMUSCULAR | Status: DC | PRN
  Administered 2015-09-11: 0.5 mg via INTRAVENOUS

## 2015-09-11 MED ORDER — KCL IN DEXTROSE-NACL 20-5-0.45 MEQ/L-%-% IV SOLN
INTRAVENOUS | Status: AC
  Filled 2015-09-11 (×2): qty 1000

## 2015-09-11 MED ORDER — PHENYLEPHRINE HCL 10 MG/ML IJ SOLN
INTRAMUSCULAR | Status: DC | PRN
  Administered 2015-09-11: 40 ug via INTRAVENOUS

## 2015-09-11 MED ORDER — ONDANSETRON HCL 4 MG/2ML IJ SOLN
INTRAMUSCULAR | Status: DC | PRN
  Administered 2015-09-11 (×2): 2 mg via INTRAVENOUS

## 2015-09-11 MED ORDER — SODIUM CHLORIDE 0.9 % IR SOLN
Status: DC | PRN
  Administered 2015-09-11: 500 mL

## 2015-09-11 MED ORDER — GELATIN ABSORBABLE MT POWD
OROMUCOSAL | Status: DC | PRN
  Administered 2015-09-11: 10 mL via TOPICAL

## 2015-09-11 MED ORDER — POLYETHYLENE GLYCOL 3350 17 G PO PACK: 17.0000 g | PACK | Freq: Every day | ORAL | Status: DC | PRN

## 2015-09-11 MED ORDER — LIDOCAINE HCL (CARDIAC) 20 MG/ML IV SOLN
INTRAVENOUS | Status: DC | PRN
  Administered 2015-09-11: 100 mg via INTRAVENOUS

## 2015-09-11 MED ORDER — DOCUSATE SODIUM 100 MG PO CAPS
100.0000 mg | ORAL_CAPSULE | Freq: Two times a day (BID) | ORAL | Status: DC
  Administered 2015-09-11 – 2015-09-12 (×2): 100 mg via ORAL

## 2015-09-11 MED ORDER — CEFAZOLIN SODIUM-DEXTROSE 2-4 GM/100ML-% IV SOLN
2.0000 g | Freq: Three times a day (TID) | INTRAVENOUS | Status: AC
  Administered 2015-09-11 – 2015-09-12 (×2): 2 g via INTRAVENOUS
  Filled 2015-09-11 (×2): qty 100

## 2015-09-11 MED ORDER — RISAQUAD PO CAPS
1.0000 | ORAL_CAPSULE | Freq: Every day | ORAL | Status: DC
  Administered 2015-09-11 – 2015-09-12 (×2): 1 via ORAL
  Filled 2015-09-11 (×2): qty 1

## 2015-09-11 MED ORDER — DOCUSATE SODIUM 100 MG PO CAPS: 100.0000 mg | ORAL_CAPSULE | Freq: Two times a day (BID) | ORAL | Status: DC | PRN

## 2015-09-11 MED ORDER — PROPOFOL 10 MG/ML IV BOLUS
INTRAVENOUS | Status: DC | PRN
  Administered 2015-09-11: 200 mg via INTRAVENOUS

## 2015-09-11 MED ORDER — ONDANSETRON HCL 4 MG/2ML IJ SOLN: 4.0000 mg | Freq: Four times a day (QID) | INTRAMUSCULAR | Status: DC

## 2015-09-11 MED ORDER — OXYCODONE-ACETAMINOPHEN 5-325 MG PO TABS
1.0000 | ORAL_TABLET | ORAL | Status: DC | PRN
  Administered 2015-09-11 – 2015-09-12 (×3): 2 via ORAL
  Filled 2015-09-11 (×3): qty 2

## 2015-09-11 MED ORDER — FENTANYL CITRATE (PF) 100 MCG/2ML IJ SOLN
25.0000 ug | INTRAMUSCULAR | Status: DC | PRN
  Administered 2015-09-11 (×2): 50 ug via INTRAVENOUS

## 2015-09-11 MED ORDER — OXYCODONE-ACETAMINOPHEN 5-325 MG PO TABS: 1.0000 | ORAL_TABLET | ORAL | Status: DC | PRN

## 2015-09-11 MED ORDER — METHOCARBAMOL 1000 MG/10ML IJ SOLN
500.0000 mg | Freq: Four times a day (QID) | INTRAVENOUS | Status: DC | PRN
  Administered 2015-09-11: 500 mg via INTRAVENOUS
  Filled 2015-09-11 (×2): qty 5

## 2015-09-11 MED ORDER — HYDROMORPHONE HCL 1 MG/ML IJ SOLN
INTRAMUSCULAR | Status: AC
  Administered 2015-09-11: 1 mg via INTRAVENOUS
  Filled 2015-09-11: qty 1

## 2015-09-11 MED ORDER — FENTANYL CITRATE (PF) 100 MCG/2ML IJ SOLN
INTRAMUSCULAR | Status: AC
  Filled 2015-09-11: qty 2

## 2015-09-11 MED ORDER — BISACODYL 5 MG PO TBEC: 5.0000 mg | DELAYED_RELEASE_TABLET | Freq: Every day | ORAL | Status: DC | PRN

## 2015-09-11 MED ORDER — BUPIVACAINE-EPINEPHRINE (PF) 0.5% -1:200000 IJ SOLN
INTRAMUSCULAR | Status: DC | PRN
  Administered 2015-09-11: 14 mL

## 2015-09-11 MED ORDER — DEXAMETHASONE SODIUM PHOSPHATE 10 MG/ML IJ SOLN
INTRAMUSCULAR | Status: DC | PRN
Start: 2015-09-11 — End: 2015-09-11
  Administered 2015-09-11: 10 mg via INTRAVENOUS

## 2015-09-11 MED ORDER — METHOCARBAMOL 500 MG PO TABS
500.0000 mg | ORAL_TABLET | Freq: Four times a day (QID) | ORAL | Status: DC | PRN
  Administered 2015-09-12: 500 mg via ORAL
  Filled 2015-09-11: qty 1

## 2015-09-11 MED ORDER — METHOCARBAMOL 500 MG PO TABS: 500.0000 mg | ORAL_TABLET | Freq: Four times a day (QID) | ORAL | Status: DC | PRN

## 2015-09-11 MED ORDER — ONDANSETRON HCL 4 MG/2ML IJ SOLN
4.0000 mg | INTRAMUSCULAR | Status: DC | PRN
  Administered 2015-09-11: 4 mg via INTRAVENOUS
  Filled 2015-09-11: qty 2

## 2015-09-11 MED ORDER — POTASSIUM CHLORIDE IN NACL 20-0.9 MEQ/L-% IV SOLN
INTRAVENOUS | Status: DC
  Administered 2015-09-11: 50 mL/h via INTRAVENOUS
  Filled 2015-09-11 (×2): qty 1000

## 2015-09-11 MED ORDER — HYDROMORPHONE HCL 1 MG/ML IJ SOLN
0.5000 mg | INTRAMUSCULAR | Status: DC | PRN
  Administered 2015-09-11 (×3): 1 mg via INTRAVENOUS
  Filled 2015-09-11 (×3): qty 1

## 2015-09-11 MED ORDER — PROPOFOL 500 MG/50ML IV EMUL
INTRAVENOUS | Status: DC | PRN
  Administered 2015-09-11: 25 ug/kg/min via INTRAVENOUS

## 2015-09-11 MED ORDER — ACETAMINOPHEN 650 MG RE SUPP: 650.0000 mg | RECTAL | Status: DC | PRN

## 2015-09-11 MED ORDER — FENTANYL CITRATE (PF) 100 MCG/2ML IJ SOLN
INTRAMUSCULAR | Status: DC | PRN
  Administered 2015-09-11 (×5): 50 ug via INTRAVENOUS

## 2015-09-11 SURGICAL SUPPLY — 53 items
CLEANER TIP ELECTROSURG 2X2 (MISCELLANEOUS) ×3 IMPLANT
CLOSURE WOUND 1/2 X4 (GAUZE/BANDAGES/DRESSINGS) ×1
CLOTH 2% CHLOROHEXIDINE 3PK (PERSONAL CARE ITEMS) ×3 IMPLANT
DRAPE MICROSCOPE LEICA (MISCELLANEOUS) ×3 IMPLANT
DRAPE SHEET LG 3/4 BI-LAMINATE (DRAPES) IMPLANT
DRAPE SURG 17X11 SM STRL (DRAPES) ×3 IMPLANT
DRAPE UTILITY XL STRL (DRAPES) ×3 IMPLANT
DRSG AQUACEL AG ADV 3.5X 4 (GAUZE/BANDAGES/DRESSINGS) ×3 IMPLANT
DRSG AQUACEL AG ADV 3.5X 6 (GAUZE/BANDAGES/DRESSINGS) IMPLANT
DURAPREP 26ML APPLICATOR (WOUND CARE) ×3 IMPLANT
DURASEAL SPINE SEALANT 3ML (MISCELLANEOUS) IMPLANT
ELECT BLADE TIP CTD 4 INCH (ELECTRODE) IMPLANT
ELECT REM PT RETURN 9FT ADLT (ELECTROSURGICAL) ×3
ELECTRODE REM PT RTRN 9FT ADLT (ELECTROSURGICAL) ×1 IMPLANT
GLOVE BIOGEL PI IND STRL 7.0 (GLOVE) ×1 IMPLANT
GLOVE BIOGEL PI IND STRL 7.5 (GLOVE) ×1 IMPLANT
GLOVE BIOGEL PI INDICATOR 7.0 (GLOVE) ×2
GLOVE BIOGEL PI INDICATOR 7.5 (GLOVE) ×2
GLOVE SURG SS PI 7.0 STRL IVOR (GLOVE) ×3 IMPLANT
GLOVE SURG SS PI 7.5 STRL IVOR (GLOVE) ×6 IMPLANT
GLOVE SURG SS PI 8.0 STRL IVOR (GLOVE) ×6 IMPLANT
GOWN STRL REUS W/ TWL LRG LVL3 (GOWN DISPOSABLE) ×1 IMPLANT
GOWN STRL REUS W/TWL LRG LVL3 (GOWN DISPOSABLE) ×3
GOWN STRL REUS W/TWL XL LVL3 (GOWN DISPOSABLE) ×6 IMPLANT
IV CATH 14GX2 1/4 (CATHETERS) IMPLANT
KIT BASIN OR (CUSTOM PROCEDURE TRAY) ×3 IMPLANT
KIT POSITIONING SURG ANDREWS (MISCELLANEOUS) ×3 IMPLANT
MANIFOLD NEPTUNE II (INSTRUMENTS) ×3 IMPLANT
MARKER SKIN DUAL TIP RULER LAB (MISCELLANEOUS) ×3 IMPLANT
NEEDLE HYPO 22GX1.5 SAFETY (NEEDLE) ×2 IMPLANT
NEEDLE SPNL 18GX3.5 QUINCKE PK (NEEDLE) ×6 IMPLANT
PACK LAMINECTOMY ORTHO (CUSTOM PROCEDURE TRAY) ×3 IMPLANT
PATTIES SURGICAL .5 X.5 (GAUZE/BANDAGES/DRESSINGS) ×3 IMPLANT
PATTIES SURGICAL .75X.75 (GAUZE/BANDAGES/DRESSINGS) ×3 IMPLANT
PATTIES SURGICAL 1X1 (DISPOSABLE) IMPLANT
RUBBERBAND STERILE (MISCELLANEOUS) ×3 IMPLANT
SPONGE LAP 4X18 X RAY DECT (DISPOSABLE) IMPLANT
SPONGE SURGIFOAM ABS GEL 100 (HEMOSTASIS) ×3 IMPLANT
STAPLER VISISTAT (STAPLE) IMPLANT
STRIP CLOSURE SKIN 1/2X4 (GAUZE/BANDAGES/DRESSINGS) ×2 IMPLANT
SUT NURALON 4 0 TR CR/8 (SUTURE) IMPLANT
SUT PROLENE 3 0 PS 2 (SUTURE) ×3 IMPLANT
SUT VIC AB 1 CT1 27 (SUTURE) ×6
SUT VIC AB 1 CT1 27XBRD ANTBC (SUTURE) ×2 IMPLANT
SUT VIC AB 1-0 CT2 27 (SUTURE) IMPLANT
SUT VIC AB 2-0 CT1 27 (SUTURE) ×3
SUT VIC AB 2-0 CT1 TAPERPNT 27 (SUTURE) ×1 IMPLANT
SUT VIC AB 2-0 CT2 27 (SUTURE) IMPLANT
SYR 3ML LL SCALE MARK (SYRINGE) ×3 IMPLANT
SYR CONTROL 10ML LL (SYRINGE) ×3 IMPLANT
TOWEL OR 17X26 10 PK STRL BLUE (TOWEL DISPOSABLE) ×3 IMPLANT
TOWEL OR NON WOVEN STRL DISP B (DISPOSABLE) ×3 IMPLANT
YANKAUER SUCT BULB TIP NO VENT (SUCTIONS) ×3 IMPLANT

## 2015-09-11 NOTE — Transfer of Care (Signed)
Immediate Anesthesia Transfer of Care Note  Patient: Wayne Miller  Procedure(s) Performed: Procedure(s): MICRO LUMBAR DECOMPRESSION OF RIGHT L4-5 AND L5-S1 AND RIGHT FORAMINOTOMY L5 (2 LEVELS) (Right)  Patient Location: PACU  Anesthesia Type:General  Level of Consciousness:  sedated, patient cooperative and responds to stimulation  Airway & Oxygen Therapy:Patient Spontanous Breathing and Patient connected to face mask oxgen  Post-op Assessment:  Report given to PACU RN and Post -op Vital signs reviewed and stable  Post vital signs:  Reviewed and stable  Last Vitals:  Filed Vitals:   09/11/15 0603  BP: 148/91  Pulse: 72  Temp: 36.7 C  Resp: 16    Complications: No apparent anesthesia complications

## 2015-09-11 NOTE — Interval H&P Note (Signed)
History and Physical Interval Note:  09/11/2015 7:55 AM  Wayne Miller  has presented today for surgery, with the diagnosis of HNP STENOSIS   The various methods of treatment have been discussed with the patient and family. After consideration of risks, benefits and other options for treatment, the patient has consented to  Procedure(s): MICRO LUMBAR DECOMPRESSION L4-5 L5 S1 RIGHT FORAMINOTOMY L5 RIGHT      (2LEVELS) (Right) as a surgical intervention .  The patient's history has been reviewed, patient examined, no change in status, stable for surgery.  I have reviewed the patient's chart and labs.  Questions were answered to the patient's satisfaction.     Wayne Miller C

## 2015-09-11 NOTE — H&P (View-Only) (Signed)
Wayne Miller is an 35 y.o. male.   Chief Complaint: Right lower extremity radicular pain. HPI: The patient is a 35 year old male who presents with back pain. The patient is here today for a surgical consult and in referral from Dr. Nelva Bush. The patient reports low back symptoms which began 13 week(s) (6 days) ago following a specific injury. The injury occurred 05/16/2015 at work due to lifting and bending. Symptoms are reported to be located in the low back and Symptoms include pain. The pain radiates to the right buttock, right posterior thigh and right posterior lower leg. Symptoms are exacerbated by standing. Current treatment includes nonsteroidal anti-inflammatory drugs, muscle relaxants and activity modification. Past evaluation has included x-ray of the lumbar spine and MRI of the lumbar spine. Past treatment has included nonsteroidal anti-inflammatory drugs, opioid analgesics, muscle relaxants, activity modification and physical therapy. 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 Note for "Back pain": The patient currently has light duty restrictions of no lifting over 10 lbs, no forceful twisting, pushing, pulling greater than 10 lbs, no bending, stooping, squatting greater than 5 times per hour and no going up stairs more than one time per hour.  This is a 35 year old male who is kindly referred by Dr. Suella Broad for evaluation of above-mentioned symptoms. The patient reports a work-related injury back in early December 2016. He was lifting a 128-pound object at work. He had acute pain in his back and into his legs. seen in the emergency room, referred to Dr. Nelva Bush, diagnosed him with acute lumbar strain with radiculopathy. He underwent physical therapy and due to the persistence of his symptoms, the patient required MRI. He had persistently elevated pain. No helped from narcotic analgesics. The MRI indicated a disc herniation small at L4-5 to the right with displacing the L5  root. At L5-S1, there was severe neuroforaminal stenosis on the right, moderate on the left with associated disc protrusion. The patient reports radiating pain in the lateral aspect of the thigh and to the calf, worse with standing and better with sitting. Worsening from his physical therapy. Difficult to perform his activities of daily living. He presents here for a surgical discussion. Dr. Nelva Bush has discussed activity modification, analgesics, and injections. He thought he was a candidate for an injection at L4-5. He is here with his translator as well. The patient's pain drawings are organic. He rates his pain in the L5-S1 nerve root distribution.  Past Medical History  Diagnosis Date  . Herniated nucleus pulposis of lumbosacral region     Past Surgical History  Procedure Laterality Date  . Knee arthroscopy Left     left knee scope "tear repair"    No family history on file. Social History:  reports that he quit smoking about 2 years ago. His smoking use included Cigarettes. He has never used smokeless tobacco. He reports that he does not drink alcohol or use illicit drugs.  Allergies: No Known Allergies   (Not in a hospital admission)  No results found for this or any previous visit (from the past 48 hour(s)). No results found.  Review of Systems  Constitutional: Negative.   HENT: Negative.   Eyes: Negative.   Respiratory: Negative.   Cardiovascular: Negative.   Gastrointestinal: Negative.   Genitourinary: Negative.   Musculoskeletal: Positive for back pain.  Skin: Negative.   Neurological: Positive for sensory change and focal weakness.    There were no vitals taken for this visit. Physical Exam  Constitutional: He is oriented to person, place, and time. He appears well-developed and well-nourished. He appears distressed.  HENT:  Head: Normocephalic.  Eyes: Pupils are equal, round, and reactive to light.  Neck: Normal range of motion.  Cardiovascular: Normal rate.    Respiratory: Effort normal.  GI: Soft.  Musculoskeletal:  On exam, he is upright, in moderate distress. Mood and affect is appropriate. Straight leg raise is buttock, thigh, and calf pain on the right, negative on the left. He has pain with extension, relieved with forward flexion. Trace EHL weakness on the right compared to the left. Decreased Achilles reflex on the right compared to the left.  Lumbar spine exam reveals no evidence of soft tissue swelling, deformity or skin ecchymosis. On palpation there is no tenderness of the lumbar spine. No flank pain with percussion. The abdomen is soft and nontender. Nontender over the trochanters. No cellulitis or lymphadenopathy.  Motor is 5/5 including tibialis anterior, plantar flexion, quadriceps and hamstrings. Patient is normoreflexic. There is no Babinski or clonus. Sensory exam is intact to light touch. Patient has good distal pulses. No DVT. No pain and normal range of motion without instability of the hips, knees and ankles.  Neurological: He is alert and oriented to person, place, and time.    AP, lateral, flexion, and extension radiographs of the lumbar spine demonstrate some disc space narrowing at L5-S1 with neuroforaminal stenosis at L5 on the right with an increased lumbosacral angle.  MRI dated 07/12/2015 demonstrates the paracentral disc herniation at L4-5 with slight displacement of the L5 root and severe neuroforaminal stenosis at L5-S1 multifactorial. Compression of the L5 root noted. Superior articulating process of S1 as well as a disc protrusion and increased lumbosacral angle.  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  I  had an extensive discussion with Wayne Miller concerning his current pathology, relevant anatomy, and treatment options. I do feel it is reasonable to proceed with an L4-5 epidural or selective nerve root block at L5 for therapeutic purposes. Again, I do feel that the L5 root is potentially affected at the L4-5 and at L5-S1, although more probably at L5-S1 out into the foramen. The parasagittal images at L5-S1 demonstrate significant stenosis of the L5 root into the foramen secondary to superior articulating process of S1 as well as a disc protrusion. After considerable discussions and interpretations with his pathology, relevant anatomy, and utilization of models, the patient indicates that he is not interested in any injection therapy. At this point, I explained the potential therapeutic benefit from that. He continues with 10/10 pain. He does have an aversion to needles. Certainly another option is a decompression at L4-5. I would, however, continue to trace the L5 nerve root with a foraminotomy and evaluation of the space at L5-S1 as the compression into the foramen of L5-S1 I feel is significant. Evaluation of the disc at L4-5, again a small disc herniation of L4-5, I think if that was the only pathology here may be either cured him with conservative treatment or amenable with epidural. Again, I reviewed his chart in detail. I spent considerable time discussing these issues. We discussed then the procedure, a decompression at L4-5 and L5-S1 and foraminotomy at L5.  I had an extensive discussion of the risks and  benefits of the lumbar decompression with the patient including bleeding, infection, damage to neurovascular structures, epidural fibrosis, CSF leak requiring repair. We also discussed increase in pain, adjacent segment disease, recurrent disc herniation, need for future surgery including repeat decompression and/or fusion. We also discussed risks of postoperative hematoma, paralysis, anesthetic  complications including DVT, PE, death, cardiopulmonary dysfunction. In addition, the perioperative and postoperative courses were discussed in detail including the rehabilitative time and return to functional activity and work. I provided the patient with an illustrated handout and utilized the appropriate surgical models.  His other options are again to live with the symptoms the way they are. He reports he is not willing to do so. He rates his pain as 10/10. Continued pain management injections versus a decompression, I do feel at least his disc protrusion his neural compression is secondary to the mechanism of injury described. I then indicate most likely with that mechanism a myofascial injury with residual myofascial pain. Overnight in the hospital, two weeks suture removal, six weeks of formal supervised physical therapy, light duty at six weeks, potential work conditioning, and maximum medical improvement at 12 weeks postoperatively. He is otherwise healthy. He is currently not a smoker. No chest pain or shortness of breath. If he changes his mind in terms of the injection, we discussed L5 nerve root block or epidural at L4-5. I appreciate the opportunity to discuss all these issues.  Plan microlumbar decompression L4-5, L5-S1 right, foraminotomy L5 right  Cecilie Kicks., PA-C for Dr. Tonita Cong 09/04/2015, 3:36 PM

## 2015-09-11 NOTE — Anesthesia Postprocedure Evaluation (Signed)
Anesthesia Post Note  Patient: Wayne Miller  Procedure(s) Performed: Procedure(s) (LRB): MICRO LUMBAR DECOMPRESSION OF RIGHT L4-5 AND L5-S1 AND RIGHT FORAMINOTOMY L5 (2 LEVELS) (Right)  Patient location during evaluation: PACU Anesthesia Type: General Level of consciousness: awake and alert Pain management: pain level controlled Vital Signs Assessment: post-procedure vital signs reviewed and stable Respiratory status: spontaneous breathing, nonlabored ventilation, respiratory function stable and patient connected to nasal cannula oxygen Cardiovascular status: blood pressure returned to baseline and stable Postop Assessment: no signs of nausea or vomiting Anesthetic complications: no    Last Vitals:  Filed Vitals:   09/11/15 1045 09/11/15 1100  BP: 138/94 139/89  Pulse: 104 94  Temp:  36.7 C  Resp: 14 12    Last Pain:  Filed Vitals:   09/11/15 1107  PainSc: 3                  Alexis Frock

## 2015-09-11 NOTE — Op Note (Signed)
NAMETAJIRI, SEKEL NO.:  [id number]  MEDICAL RECORD NO.:  [id number]  LOCATION:  [facility]                         FACILITY:  [facility]  PHYSICIAN:  Susa Day, M.D.    DATE OF BIRTH:  02-09-1981  DATE OF PROCEDURE:  09/11/2015 DATE OF DISCHARGE:                              OPERATIVE REPORT   PREOPERATIVE DIAGNOSIS:  Spinal stenosis, L5-S1, L4-5.  POSTOPERATIVE DIAGNOSIS:  Spinal stenosis, L5-S1, L4-5.  PROCEDURE PERFORMED: 1. Microlumbar decompression L4-5, L5-S1, right. 2. Hemilaminectomy L5, right. 3. Foraminotomy L5-S1, right.  ANESTHESIA:  General.  ASSISTANT:  Lacie Draft, PA  HISTORY:  A 35 year old with severe right lower extremity radicular pain secondary to neurapraxia of the L5 nerve root.  Severe neural foraminal stenosis, multifactorial, facet hypertrophy, ligamentum flavum hypertrophy, small disk protrusion.  He had degenerative disks at 4-5 as well with myotomal weakness, dermatomal dysesthesias indicated for decompression, full decompression in the L5 nerve root.  He had exsanguinating of the L5 lamina and we discussed for full decompression in the L5 nerve root, to decompress 4-5, 5-1, and 4-5.  Risks and benefits were discussed including bleeding, infection, damage to neurovascular structures, DVT, PE, anesthetic complications, no change in symptoms, worsening symptoms, etc.  TECHNIQUE:  Patient in supine position, after induction of adequate general anesthesia, 2 g Kefzol, placed prone on the [place] frame.  All bony prominences were well padded.  Lumbar region was prepped and draped in usual sterile fashion.  Two 18-gauge spinal needle was utilized to localize 4-5, 5-1 interspace.  Confirmed with x-ray.  Incision was made from the spinous process of above 5 to the S1.  Subcutaneous tissue was dissected.  Electrocautery was utilized to achieve hemostasis. Dorsolumbar fascia was infiltrated with 0.25% Marcaine with epinephrine. It  was divided in line with skin incision.  McCulloch retractors placed. Operating microscope was draped, brought in the surgical field.  The single L5 lamina was noted as well as facet hypertrophy.  We performed hemilaminotomy of the caudad edge of 5.  With a facet hypertrophy, an osteotome to remove 20% of the border of the medial aspect of the inferior articulating process of 5.  We then identified the superior articulating process of S1.  Ligamentum flavum and synovial hypertrophy extending into the foramen as well as the bony aspect of the superior articulating process of S1 compressing the 5 root.  I performed a generous foraminotomy of 5.  Decompressed lateral recess to the medial border of the pedicle.  Removing the portion of the superior articulating process of S1.  Ligamentum flavum from that lateral recess. The disk was mildly protruded, but not ruptured at 5-1.  We continued cephalad as the superior arch of 5 was seen to be contributing to this neural compression due to its shingling.  We removed the hemi-lamina of 5 and removed some ligamentum of lateral recess at 4-5, fully identifying the 5 root.  There was no disk rupture at 4-5 noted.  I followed the 5 root out beneath the pedicle of 5 and neural probe passed freely up the foramen of 5 and S1 and we obtained a confirmatory radiograph at the side of the decompression.  Bipolar electrocautery was  utilized to achieve hemostasis as was thrombin-soaked Gelfoam.  No evidence of CSF leakage or active bleeding.  The fiber was erythematous and edematous, but well decompressed as was the S1 nerve root.  Thrombin- soaked Gelfoam was placed in laminotomy defect, removed McCulloch retractor, copiously irrigated the wound.  No active bleeding or CSF leakage.  Dorsolumbar fascia reapproximated with 1 Vicryl, subcu with 2- 0, skin with Prolene.  Sterile dressing applied, placed supine on hospital bed, extubated without difficulty, and  transported to the recovery room in satisfactory condition.  The patient tolerated the procedure well.  No complications.  Assistant, Lacie Draft, [place].  Minimal blood loss.     Susa Day, M.D.     Geralynn Rile  D:  09/11/2015  T:  09/11/2015  Job:  [id number]

## 2015-09-11 NOTE — Care Management Note (Signed)
Case Management Note  Patient Details  Name: Wayne Miller MRN: [id number] Date of Birth: 1980/12/01  Subjective/Objective:                  MICRO LUMBAR DECOMPRESSION OF RIGHT L4-5 AND L5-S1 AND RIGHT FORAMINOTOMY L5 (2 LEVELS) (Right) Action/Plan: Discharge planning Expected Discharge Date:                  Expected Discharge Plan:  Home/Self Care  In-House Referral:     Discharge planning Services  CM Consult  Post Acute Care Choice:    Choice offered to:  Patient  DME Arranged:  3-N-1, Walker rolling DME Agency:     HH Arranged:    HH Agency:     Status of Service:  In process, will continue to follow  Medicare Important Message Given:    Date Medicare IM Given:    Medicare IM give by:    Date Additional Medicare IM Given:    Additional Medicare Important Message give by:     If discussed at [place] of Stay Meetings, dates discussed:    Additional Comments: CM spoke with pt via [place] to discuss home health needs.  CM called [employer], rep Allean Found [phone] who requested I fax orders for 3n1 and rolling walker to [phone].  CM faxed requested information.  CM will continue to follow for [facility] services.   Dellie Catholic, RN 09/11/2015, 4:07 PM

## 2015-09-11 NOTE — Brief Op Note (Signed)
09/11/2015  9:56 AM  PATIENT:  Tresea Mall  35 y.o. male  PRE-OPERATIVE DIAGNOSIS:  HNP STENOSIS   POST-OPERATIVE DIAGNOSIS:  HNP STENOSIS   PROCEDURE:  Procedure(s): MICRO LUMBAR DECOMPRESSION OF RIGHT L4-5 AND L5-S1 AND RIGHT FORAMINOTOMY L5 (2 LEVELS) (Right)  SURGEON:  Surgeon(s) and Role:    * Susa Day, MD - Primary  PHYSICIAN ASSISTANT:   ASSISTANTS: [place]   ANESTHESIA:   general  EBL:  Total I/O In: -  Out: 200 [Urine:200]  BLOOD ADMINISTERED:none  DRAINS: none   LOCAL MEDICATIONS USED:  MARCAINE     SPECIMEN:  No Specimen  DISPOSITION OF SPECIMEN:  N/A  COUNTS:  YES  TOURNIQUET:  * No tourniquets in log *  DICTATION: .Other Dictation: Dictation Number [id number]  PLAN OF CARE: Admit for overnight observation  PATIENT DISPOSITION:  PACU - hemodynamically stable.   Delay start of Pharmacological VTE agent (>24hrs) due to surgical blood loss or risk of bleeding: yes

## 2015-09-11 NOTE — Anesthesia Procedure Notes (Signed)
Procedure Name: Intubation Date/Time: 09/11/2015 9:14 AM Performed by: Freddie Breech Pre-anesthesia Checklist: Patient identified, Emergency Drugs available, Suction available, Patient being monitored and Timeout performed Patient Re-evaluated:Patient Re-evaluated prior to inductionOxygen Delivery Method: Circle system utilized Preoxygenation: Pre-oxygenation with 100% oxygen Intubation Type: IV induction Ventilation: Mask ventilation without difficulty and Oral airway inserted - appropriate to patient size Laryngoscope Size: Mac and 4 Grade View: Grade II Tube type: Oral Tube size: 7.5 mm Number of attempts: 1 Airway Equipment and Method: Patient positioned with wedge pillow and Stylet Placement Confirmation: ETT inserted through vocal cords under direct vision,  breath sounds checked- equal and bilateral,  positive ETCO2 and CO2 detector Secured at: 22 cm Tube secured with: Tape Dental Injury: Teeth and Oropharynx as per pre-operative assessment and Injury to lip

## 2015-09-11 NOTE — Discharge Instructions (Signed)
Walk As Tolerated utilizing back precautions.  No bending, twisting, or lifting.  No driving for 2 weeks.   °Aquacel dressing may remain in place until follow up. May shower with aquacel dressing in place. If the dressing peels off or becomes saturated, you may remove aquacel dressing and place gauze and tape dressing which should be kept clean and dry and changed daily. Do not remove steri-strips if they are present. °See Dr. Quan Cybulski in office in 10 to 14 days. Begin taking aspirin 81mg per day starting 4 days after your surgery if not allergic to aspirin or on another blood thinner. °Walk daily even outside. Use a cane or walker only if necessary. °Avoid sitting on soft sofas. ° °

## 2015-09-12 DIAGNOSIS — M4806 Spinal stenosis, lumbar region: Secondary | ICD-10-CM | POA: Diagnosis not present

## 2015-09-12 MED ORDER — NAPROXEN 500 MG PO TABS: 500.0000 mg | ORAL_TABLET | Freq: Two times a day (BID) | ORAL | Status: DC

## 2015-09-12 NOTE — Discharge Summary (Signed)
Physician Discharge Summary   Patient ID: Wayne Miller MRN: [id number] DOB/AGE: 11-27-1980 35 y.o.  Admit date: 09/11/2015 Discharge date: 09/12/2015  Primary Diagnosis:   HNP STENOSIS   Admission Diagnoses:  Past Medical History  Diagnosis Date  . Herniated nucleus pulposis of lumbosacral region    Discharge Diagnoses:   Active Problems:   Spinal stenosis of lumbar region  Procedure:  Procedure(s) (LRB): MICRO LUMBAR DECOMPRESSION OF RIGHT L4-5 AND L5-S1 AND RIGHT FORAMINOTOMY L5 (2 LEVELS) (Right)   Consults: None  HPI:  see H&P    Laboratory Data: Hospital Outpatient Visit on 08/30/2015  Component Date Value Ref Range Status  . MRSA, PCR 08/30/2015 NEGATIVE  NEGATIVE Final  . Staphylococcus aureus 08/30/2015 NEGATIVE  NEGATIVE Final   Comment:        The Xpert SA Assay (FDA approved for NASAL specimens in patients over 2 years of age), is one component of a comprehensive surveillance program.  Test performance has been validated by [facility] for patients greater than or equal to 37 year old. It is not intended to diagnose infection nor to guide or monitor treatment.   . Sodium 08/30/2015 140  135 - 145 mmol/L Final  . Potassium 08/30/2015 4.9  3.5 - 5.1 mmol/L Final  . Chloride 08/30/2015 106  101 - 111 mmol/L Final  . CO2 08/30/2015 25  22 - 32 mmol/L Final  . Glucose, Bld 08/30/2015 90  65 - 99 mg/dL Final  . BUN 08/30/2015 10  6 - 20 mg/dL Final  . Creatinine, Ser 08/30/2015 0.86  0.61 - 1.24 mg/dL Final  . Calcium 08/30/2015 9.3  8.9 - 10.3 mg/dL Final  . GFR calc non Af Amer 08/30/2015 >60  >60 mL/min Final  . GFR calc Af Amer 08/30/2015 >60  >60 mL/min Final   Comment: (NOTE) The eGFR has been calculated using the CKD EPI equation. This calculation has not been validated in all clinical situations. eGFR's persistently <60 mL/min signify possible Chronic Kidney Disease.   . Anion gap 08/30/2015 9  5 - 15 Final  . WBC 08/30/2015 7.1  4.0 -  10.5 K/uL Final  . RBC 08/30/2015 5.00  4.22 - 5.81 MIL/uL Final  . Hemoglobin 08/30/2015 15.2  13.0 - 17.0 g/dL Final  . HCT 08/30/2015 45.5  39.0 - 52.0 % Final  . MCV 08/30/2015 91.0  78.0 - 100.0 fL Final  . MCH 08/30/2015 30.4  26.0 - 34.0 pg Final  . MCHC 08/30/2015 33.4  30.0 - 36.0 g/dL Final  . RDW 08/30/2015 13.3  11.5 - 15.5 % Final  . Platelets 08/30/2015 279  150 - 400 K/uL Final  . ABO/RH(D) 08/30/2015 O POS   Final  . Antibody Screen 08/30/2015 NEG   Final  . Sample Expiration 08/30/2015 09/13/2015   Final  . Extend sample reason 08/30/2015 NO TRANSFUSIONS OR PREGNANCY IN THE PAST 3 MONTHS   Final   No results for input(s): HGB in the last 72 hours. No results for input(s): WBC, RBC, HCT, PLT in the last 72 hours. No results for input(s): NA, K, CL, CO2, BUN, CREATININE, GLUCOSE, CALCIUM in the last 72 hours. No results for input(s): LABPT, INR in the last 72 hours.  X-Rays:Dg Lumbar Spine 2-3 Views  08/30/2015  CLINICAL DATA:  Herniated nucleus pulposis in the lumbar spine. EXAM: LUMBAR SPINE - 2-3 VIEW COMPARISON:  None. FINDINGS: The disc spaces are maintained. No fracture or bone destruction or subluxation. No appreciable facet arthritis. IMPRESSION:  Normal exam. Electronically Signed   By: Lorriane Shire M.D.   On: 08/30/2015 11:12   Dg Spine Portable 1 View  09/11/2015  CLINICAL DATA:  Intraoperative localization EXAM: PORTABLE SPINE - 1 VIEW COMPARISON:  09/11/2015 FINDINGS: Posterior surgical instruments are directed at the L5-S1 level and posterior to the L5 vertebral body at the level of the L5 pedicles. IMPRESSION: Intraoperative localization as above. Electronically Signed   By: Rolm Baptise M.D.   On: 09/11/2015 09:52   Dg Spine Portable 1 View  09/11/2015  CLINICAL DATA:  Intraoperative localization EXAM: PORTABLE SPINE - 1 VIEW COMPARISON:  09/11/2015 FINDINGS: Intraoperative localization demonstrates posterior surgical instruments at the L5-S1 level. IMPRESSION:  Intraoperative localization as above.l Electronically Signed   By: Rolm Baptise M.D.   On: 09/11/2015 09:15   Dg Spine Portable 1 View  09/11/2015  CLINICAL DATA:  Intraoperative film #1, please number spinous process for surgeon, L4-L5, L5-S1 lumbar decompression EXAM: PORTABLE SPINE - 1 VIEW COMPARISON:  08/30/2015 FINDINGS: Single lateral intraoperative lumbar radiograph shows needles in the soft tissues posterior to the L4 and L5 spinous processes. IMPRESSION: 1. Intraoperative localization. Electronically Signed   By: Lucrezia Europe M.D.   On: 09/11/2015 09:07    EKG: Orders placed or performed in visit on 03/07/10  . Converted CEMR EKG     Hospital Course: Patient was admitted to [facility] and taken to the OR and underwent the above state procedure without complications.  Patient tolerated the procedure well and was later transferred to the recovery room and then to the orthopaedic floor for postoperative care.  They were given PO and IV analgesics for pain control following their surgery.  They were given 24 hours of postoperative antibiotics.   PT was consulted postop to assist with mobility and transfers.  The patient was allowed to be WBAT with therapy and was taught back precautions. Discharge planning was consulted to help with postop disposition and equipment needs.  Patient had a good night on the evening of surgery and started to get up OOB with therapy on day one. Patient was seen in rounds and was ready to go home on day one.  They were given discharge instructions and dressing directions.  They were instructed on when to follow up in the office with Dr. Tonita Cong.   Diet: Regular diet Activity:WBAT; Lspine precautions Follow-up:in 10-14 days Disposition - Home Discharged Condition: good   Discharge Instructions    Call MD / Call 911    Complete by:  As directed   If you experience chest pain or shortness of breath, CALL 911 and be transported to the hospital emergency room.   If you develope a fever above 101 F, pus (white drainage) or increased drainage or redness at the wound, or calf pain, call your surgeon's office.     Constipation Prevention    Complete by:  As directed   Drink plenty of fluids.  Prune juice may be helpful.  You may use a stool softener, such as Colace (over the counter) 100 mg twice a day.  Use MiraLax (over the counter) for constipation as needed.     Diet - low sodium heart healthy    Complete by:  As directed      Increase activity slowly as tolerated    Complete by:  As directed             Medication List    STOP taking these medications  HYDROcodone-acetaminophen 5-325 MG tablet  Commonly known as:  NORCO/VICODIN      TAKE these medications        acetaminophen 500 MG tablet  Commonly known as:  TYLENOL  Take 1,000 mg by mouth every 6 (six) hours as needed for moderate pain.     docusate sodium 100 MG capsule  Commonly known as:  COLACE  Take 1 capsule (100 mg total) by mouth 2 (two) times daily as needed for mild constipation.     ipratropium 0.06 % nasal spray  Commonly known as:  ATROVENT  Place 2 sprays into both nostrils 4 (four) times daily.     methocarbamol 500 MG tablet  Commonly known as:  ROBAXIN  Take 1 tablet (500 mg total) by mouth every 6 (six) hours as needed for muscle spasms.     naproxen 500 MG tablet  Commonly known as:  NAPROSYN  Take 1 tablet (500 mg total) by mouth 2 (two) times daily with a meal. May resume 5 days post-op     oxyCODONE-acetaminophen 5-325 MG tablet  Commonly known as:  PERCOCET  Take 1-2 tablets by mouth every 4 (four) hours as needed for severe pain.     terbinafine 250 MG tablet  Commonly known as:  LAMISIL  Take 250 mg by mouth daily. For 3 months           Follow-up Information    Follow up with BEANE,JEFFREY C, MD In 2 weeks.   Specialty:  Orthopedic Surgery   Contact information:   [address] [place]  [address] [phone]       Signed: Lacie Draft, PA-C Orthopaedic Surgery 09/12/2015, 10:05 AM

## 2015-09-12 NOTE — Progress Notes (Signed)
Physical Therapy Treatment Patient Details Name: Wayne Miller MRN: [id number] DOB: 14-Apr-1981 Today's Date: 09/12/2015    History of Present Illness 35 y.o. male admitted for L4-5, L5-S1 decompression. Pt was injured doing heavy lifting at work Dec 2016.     PT Comments    Improved mobility this afternoon, pt able to perform bed mobility, transfers, and ambulation without physical assistance. Reviewed back precautions. Pt is ready to DC home from PT standpoint.   Follow Up Recommendations  No PT follow up;Supervision for mobility/OOB     Equipment Recommendations  Rolling walker with 5" wheels;3in1 (PT)    Recommendations for Other Services       Precautions / Restrictions Precautions Precautions: Back Precaution Booklet Issued: Yes (comment) Precaution Comments: Spanish back precautions issued, reviewed precautions in detail Restrictions Weight Bearing Restrictions: No    Mobility  Bed Mobility Overal bed mobility: Needs Assistance Bed Mobility: Rolling;Sidelying to Sit;Sit to Supine Rolling: Supervision Sidelying to sit: Supervision   Sit to supine: Supervision   General bed mobility comments: verbal cues for log roll technique, increased time, no physical assist  Transfers Overall transfer level: Needs assistance Equipment used: Rolling walker (2 wheeled) Transfers: Sit to/from Stand Sit to Stand: From elevated surface;Supervision         General transfer comment: increased time, verbal cues for back precautions and hand placement  Ambulation/Gait Ambulation/Gait assistance: Supervision Ambulation Distance (Feet): 170 Feet Assistive device: Rolling walker (2 wheeled) Gait Pattern/deviations: Decreased step length - right;Decreased step length - left;Step-through pattern   Gait velocity interpretation: Below normal speed for age/gender General Gait Details: steady with RW, no LOB   Stairs            Wheelchair Mobility    Modified Rankin  (Stroke Patients Only)       Balance Overall balance assessment: Modified Independent                                  Cognition Arousal/Alertness: Awake/alert Behavior During Therapy: WFL for tasks assessed/performed Overall Cognitive Status: Within Functional Limits for tasks assessed                      Exercises      General Comments        Pertinent Vitals/Pain Pain Score: 7  Pain Descriptors / Indicators: Tightness Pain Intervention(s): Premedicated before session;Monitored during session    Home Living                      Prior Function            PT Goals (current goals can now be found in the care plan section) Acute Rehab PT Goals Patient Stated Goal: pt likes to fish PT Goal Formulation: All assessment and education complete, DC therapy Time For Goal Achievement: 09/12/15 Progress towards PT goals: Progressing toward goals    Frequency  Min 6X/week    PT Plan Current plan remains appropriate    Co-evaluation             End of Session Equipment Utilized During Treatment: Gait belt Activity Tolerance: Patient tolerated treatment well Patient left: with call bell/phone within reach;in bed     Time: [id number] PT Time Calculation (min) (ACUTE ONLY): 13 min  Charges:  $Gait Training: 8-22 mins  G Codes:  Functional Assessment Tool Used: clinical judgement Mobility: Walking and Moving Around Discharge Status [phone]): At least 1 percent but less than 20 percent impaired, limited or restricted   Philomena Doheny 09/12/2015, 2:51 PM [phone]

## 2015-09-12 NOTE — Progress Notes (Signed)
Subjective: 1 Day Post-Op Procedure(s) (LRB): MICRO LUMBAR DECOMPRESSION OF RIGHT L4-5 AND L5-S1 AND RIGHT FORAMINOTOMY L5 (2 LEVELS) (Right) Patient reports pain as mild.  Leg pain improved. Pain well controlled.   Objective: Vital signs in last 24 hours: Temp:  [97.3 F (36.3 C)-98.7 F (37.1 C)] 97.8 F (36.6 C) (04/06 1000) Pulse Rate:  [73-108] 85 (04/06 1000) Resp:  [12-16] 16 (04/06 1000) BP: (124-145)/(80-99) 140/92 mmHg (04/06 1000) SpO2:  [97 %-100 %] 98 % (04/06 1000) Weight:  [97.977 kg (216 lb)] 97.977 kg (216 lb) (04/05 1120)  Intake/Output from previous day: 04/05 0701 - 04/06 0700 In: 3990.8 [P.O.:1200; I.V.:2590.8; IV Piggyback:200] Out: 3605 [Urine:3555; Blood:50] Intake/Output this shift: Total I/O In: 240 [P.O.:240] Out: 400 [Urine:400]  No results for input(s): HGB in the last 72 hours. No results for input(s): WBC, RBC, HCT, PLT in the last 72 hours. No results for input(s): NA, K, CL, CO2, BUN, CREATININE, GLUCOSE, CALCIUM in the last 72 hours. No results for input(s): LABPT, INR in the last 72 hours.  Neurologically intact ABD soft Neurovascular intact Sensation intact distally Intact pulses distally Dorsiflexion/Plantar flexion intact Incision: dressing C/D/I and no drainage No cellulitis present Compartment soft no sign of DVT  Assessment/Plan: 1 Day Post-Op Procedure(s) (LRB): MICRO LUMBAR DECOMPRESSION OF RIGHT L4-5 AND L5-S1 AND RIGHT FORAMINOTOMY L5 (2 LEVELS) (Right) Advance diet Up with therapy D/C IV fluids  Discussed surgery and post-op protocols, Lspine precautions D/C home today Follow up in 10-14 days for suture removal  BISSELL, JACLYN M. 09/12/2015, 10:02 AM

## 2015-09-12 NOTE — Progress Notes (Addendum)
16:30 After [facility] rep, Lecretia spent an inordinate amount of time trying to help this pt with this CM calling her and reporting WC J. Stann Mainland "account" Pura Spice, diligently and patiently pursued our request for DME; Pura Spice has delivered the DME so pt can discharge; much appreciated by this CM. CM received call from [facility] rep, [place] notifying me they would not be able to provide DME as [employer] (DME negotiator) not in network.  CM called [employer] rep [phone] to ask for ETA on DME  equipment delivery.  [employer] rep, states she will call when it has been arranged. 13:30 CM received call from [employer] rep who states they cannot find a vendor and will have to arrange tomorrow.  CM states this is unacceptable and called J. Stann Mainland [phone] [facility] adjuster) who states she will get DME today (as WC does not want to pay another day for hospitalization); Eli Hose requested [facility] number and states she will call back with plan.  Waiting for callback.  14:00 CM received callback from [place] who has contacted [facility] and paid full price for Rolling walker and 3n1 and requests I call Lecretia to please dispense.  Pura Spice is confirming with [facility] main office.  Waiting for callback.  16:00 [facility] Pharm D Suzanne on unit and sensing my frustration, was kind enough to call [facility] and arrange for Lecretia to please deliver the 3n1 and rolling walker to room.  No other needs were communicated.

## 2015-09-12 NOTE — Progress Notes (Signed)
Wayne Miller is providing the following services: RW and Commode (just received approval from my supervisor Wayne Miller to deliver equipment)  If patient discharges after hours, please call [phone].   Wayne Miller 09/12/2015, 4:14 PM

## 2015-09-12 NOTE — Progress Notes (Signed)
Occupational Therapy Treatment Patient Details Name: Wayne Miller MRN: [id number] DOB: 1981-06-05 Today's Date: 09/12/2015    History of present illness 35 y.o. male admitted for L4-5, L5-S1 decompression. Pt was injured doing heavy lifting at work Dec 2016.    OT comments  Patient instructed in AE use for LB self-care. Now all education is completed. No further OT needs. Will sign off.  Follow Up Recommendations  No OT follow up;Supervision/Assistance - 24 hour    Equipment Recommendations  3 in 1 bedside comode;Other (comment) (reacher, sock aide, long sponge, long shoe horn)    Recommendations for Other Services      Precautions / Restrictions Precautions Precautions: Back Precaution Booklet Issued: Yes (comment) Precaution Comments: Spanish back precautions issued, reviewed precautions in detail Restrictions Weight Bearing Restrictions: No       Mobility Bed Mobility             Transfers                 Balance                                  ADL Overall ADL's : Needs assistance/impaired                             General ADL Comments: Brought demo adaptive equipment and instructed (via verbal/demonstration) on their uses. Patient practiced donning pants with reacher with min A. Have asked case manager to obtain AE for patient use at home. Demonstrated reacher, long sponge, long shoe horn, sock aide to patient during session.      Vision                     Perception     Praxis      Cognition   Behavior During Therapy: WFL for tasks assessed/performed Overall Cognitive Status: Within Functional Limits for tasks assessed                      Exercises     Shoulder Instructions       General Comments      Pertinent Vitals/ Pain       Pain Assessment: 0-10 Pain Score: 5  Pain Location: back Pain Descriptors / Indicators: Sore Pain Intervention(s): Monitored during  session  Home Living Family/patient expects to be discharged to:: Private residence Living Arrangements: Parent Available Help at Discharge: Family;Available 24 hours/day Type of Home: Mobile home Home Access: Ramped entrance     Home Layout: One level     Bathroom Shower/Tub: Occupational psychologist: Standard     Home Equipment: None          Prior Functioning/Environment            Frequency      Progress Toward Goals  OT Goals(current goals can now be found in the care plan section)     Acute Rehab OT Goals   Plan All goals met and education completed, patient discharged from OT services    Co-evaluation             End of Session    Activity Tolerance Patient tolerated treatment well   Patient Left in chair;with call bell/phone within reach;with nursing/sitter in room   Nurse Communication Mobility status    Functional Assessment Tool Used: clinical judgment Functional Limitation:  Self care Self Care Current Status [phone]): At least 20 percent but less than 40 percent impaired, limited or restricted Self Care Goal Status ([id number]): At least 1 percent but less than 20 percent impaired, limited or restricted Self Care Discharge Status [phone]): At least 20 percent but less than 40 percent impaired, limited or restricted   Time: 1031-1041 OT Time Calculation (min): 10 min  Charges: OT G-codes **NOT FOR INPATIENT CLASS** Functional Assessment Tool Used: clinical judgment Functional Limitation: Self care Self Care Current Status ([id number]): At least 20 percent but less than 40 percent impaired, limited or restricted Self Care Goal Status ([id number]): At least 1 percent but less than 20 percent impaired, limited or restricted Self Care Discharge Status [phone]): At least 20 percent but less than 40 percent impaired, limited or restricted  OT General Charges $OT Visit: 1 Procedure  OT Treatments $Self Care/Home Management : 8-22  mins  Chrystina Naff A 09/12/2015, 10:53 AM

## 2015-09-12 NOTE — Progress Notes (Signed)
Occupational Therapy Evaluation Patient Details Name: Wayne Miller MRN: [id number] DOB: [birth year] Today's Date: 09/12/2015    History of Present Illness 35 y.o. male admitted for L4-5, L5-S1 decompression. Pt was injured doing heavy lifting at work Dec 2016.    Clinical Impression   Patient is s/p above surgery resulting in deficits listed below. He will benefit from skilled OT to maximize independence and to facilitate a safe discharge. OT will follow.    Follow Up Recommendations  No OT follow up;Supervision/Assistance - 24 hour    Equipment Recommendations  3 in 1 bedside comode;Other (comment) (reacher, sock aide, long sponge, long shoe horn)    Recommendations for Other Services       Precautions / Restrictions Precautions Precautions: Back Precaution Booklet Issued: Yes (comment) Precaution Comments: Spanish back precautions issued, reviewed precautions in detail Restrictions Weight Bearing Restrictions: No      Mobility Bed Mobility Overal bed mobility: Needs Assistance Bed Mobility: Rolling;Sidelying to Sit Rolling: Min assist Sidelying to sit: Mod assist       General bed mobility comments: instructed in log roll, mod A to raise trunk, limited by pain, increased time 2* pain  Transfers Overall transfer level: Needs assistance Equipment used: Rolling walker (2 wheeled) Transfers: Sit to/from Stand Sit to Stand: Min assist;From elevated surface         General transfer comment: increased time, verbal cues for back precautions and hand placement    Balance                                           ADL Overall ADL's : Needs assistance/impaired Eating/Feeding: Independent;Sitting;Bed level   Grooming: Set up;Sitting   Upper Body Bathing: Supervision/ safety;Sitting;Standing   Lower Body Bathing: Moderate assistance;Sit to/from stand   Upper Body Dressing : Set up;Supervision/safety;Sitting   Lower Body Dressing: Moderate  assistance;Sit to/from stand   Toilet Transfer: Min guard;Ambulation;Regular Toilet;BSC;RW   Toileting- Clothing Manipulation and Hygiene: Minimal assistance;Sit to/from stand   Tub/ Shower Transfer: Walk-in shower;Min guard;Rolling walker   Functional mobility during ADLs: Min guard;Rolling walker;Supervision/safety General ADL Comments: Patient educated on back precautions. Given handout in Spanish. Discussed impact on ADLs. Mother can assist some. Patient would benefit from AE for LB self-care, as well as 3 in 1 for toilet and shower use. Case manager made aware. Practiced toilet/shower transfers. Patient unable to lift legs to him to perform full LB self-care at this time and would benefit from AE.      Vision     Perception     Praxis      Pertinent Vitals/Pain Pain Assessment: 0-10 Pain Score: 8  Pain Location: back Pain Descriptors / Indicators: Sore;Pressure Pain Intervention(s): Monitored during session;Patient requesting pain meds-RN notified;Limited activity within patient's tolerance     Hand Dominance Right   Extremity/Trunk Assessment Upper Extremity Assessment Upper Extremity Assessment: Overall WFL for tasks assessed   Lower Extremity Assessment Lower Extremity Assessment: Defer to PT evaluation    Cervical / Trunk Assessment Cervical / Trunk Assessment: Normal   Communication Communication Communication: Prefers language other than English   Cognition Arousal/Alertness: Awake/alert Behavior During Therapy: WFL for tasks assessed/performed Overall Cognitive Status: Within Functional Limits for tasks assessed                     General Comments       Exercises  Shoulder Instructions      Home Living Family/patient expects to be discharged to:: Private residence Living Arrangements: Parent Available Help at Discharge: Family;Available 24 hours/day Type of Home: Mobile home Home Access: Ramped entrance     Home Layout: One  level     Bathroom Shower/Tub: Occupational psychologist: Standard     Home Equipment: None          Prior Functioning/Environment Level of Independence: Independent        Comments: worked in [employer]; was injured on the job    OT Diagnosis: Acute pain   OT Problem List: Decreased activity tolerance;Decreased knowledge of use of DME or AE;Decreased knowledge of precautions;Pain   OT Treatment/Interventions: Self-care/ADL training;DME and/or AE instruction;Therapeutic activities;Patient/family education    OT Goals(Current goals can be found in the care plan section) Acute Rehab OT Goals Patient Stated Goal: pt likes to fish OT Goal Formulation: With patient Time For Goal Achievement: 09/26/15 Potential to Achieve Goals: Good  OT Frequency: Min 2X/week   Barriers to D/C:            Co-evaluation PT/OT/SLP Co-Evaluation/Treatment: Yes Reason for Co-Treatment: For patient/therapist safety PT goals addressed during session: Mobility/safety with mobility OT goals addressed during session: ADL's and self-care;Proper use of Adaptive equipment and DME      End of Session Equipment Utilized During Treatment: Rolling walker Nurse Communication: Mobility status;Patient requests pain meds  Activity Tolerance: Patient tolerated treatment well Patient left: in chair;with call bell/phone within reach;with nursing/sitter in room   Time: 0905-0948 OT Time Calculation (min): 43 min Charges:  OT General Charges $OT Visit: 1 Procedure OT Evaluation $OT Eval Low Complexity: 1 Procedure OT Treatments $Self Care/Home Management : 8-22 mins G-Codes: OT G-codes **NOT FOR INPATIENT CLASS** Functional Assessment Tool Used: clinical judgment Functional Limitation: Self care Self Care Current Status [id number]): At least 20 percent but less than 40 percent impaired, limited or restricted Self Care Goal Status [id number]): At least 1 percent but less than 20 percent impaired,  limited or restricted  Glendene Wyer A 09/12/2015, 10:10 AM

## 2015-09-12 NOTE — Evaluation (Addendum)
Physical Therapy Evaluation Patient Details Name: Wayne Miller MRN: [id number] DOB: 10/06/1980 Today's Date: 09/12/2015   History of Present Illness  35 y.o. male admitted for L4-5, L5-S1 decompression. Pt was injured doing heavy lifting at work Dec 2016.   Clinical Impression  Patient is s/p above surgery resulting in the deficits listed below (see PT Problem List). Pt ambulated 160' with RW with supervision. Mod assist for supine to sit, min A for transfers. Instructed pt in back precautions and encouraged frequent ambulation at home. From PT standpoint, pt is ready to DC home.  Patient will benefit from skilled PT to increase their independence and safety with mobility (while adhering to their precautions) to allow discharge to the venue listed below.     Follow Up Recommendations No PT follow up;Supervision for mobility/OOB    Equipment Recommendations  Rolling walker with 5" wheels;3in1 (PT)    Recommendations for Other Services       Precautions / Restrictions Precautions Precautions: Back Precaution Booklet Issued: Yes (comment) Precaution Comments: Spanish back precautions issued, reviewed precautions in detail Restrictions Weight Bearing Restrictions: No      Mobility  Bed Mobility Overal bed mobility: Needs Assistance Bed Mobility: Rolling;Sidelying to Sit Rolling: Min assist Sidelying to sit: Mod assist       General bed mobility comments: instructed in log roll, mod A to raise trunk, limited by pain, increased time 2* pain  Transfers Overall transfer level: Needs assistance Equipment used: Rolling walker (2 wheeled) Transfers: Sit to/from Stand Sit to Stand: Min assist;From elevated surface         General transfer comment: increased time, verbal cues for back precautions and hand placement  Ambulation/Gait Ambulation/Gait assistance: Supervision Ambulation Distance (Feet): 160 Feet Assistive device: Rolling walker (2 wheeled) Gait  Pattern/deviations: Step-through pattern;Decreased step length - right;Decreased step length - left   Gait velocity interpretation: Below normal speed for age/gender General Gait Details: increased time, no LOB, verbal cues to increase step length and for positioning in RW, pt reported tightness in back, requested muscle relaxer  Stairs            Wheelchair Mobility    Modified Rankin (Stroke Patients Only)       Balance Overall balance assessment: Modified Independent                                           Pertinent Vitals/Pain Pain Assessment: 0-10 Pain Score: 8  Pain Location: back Pain Descriptors / Indicators: Sore Pain Intervention(s): Monitored during session;RN gave pain meds during session;Limited activity within patient's tolerance    Home Living Family/patient expects to be discharged to:: Private residence Living Arrangements: Parent Available Help at Discharge: Family;Available 24 hours/day Type of Home: Mobile home Home Access: Ramped entrance     Home Layout: One level Home Equipment: None      Prior Function Level of Independence: Independent               Hand Dominance        Extremity/Trunk Assessment   Upper Extremity Assessment: Overall WFL for tasks assessed           Lower Extremity Assessment: RLE deficits/detail RLE Deficits / Details: knee ext -15* AROM limited by pain, ankle DF WNL, sensation intact to light touch    Cervical / Trunk Assessment: Normal  Communication   Communication: Prefers language other  than English (Spanish is 1st language, communicates well in [place])  Cognition Arousal/Alertness: Awake/alert Behavior During Therapy: WFL for tasks assessed/performed Overall Cognitive Status: Within Functional Limits for tasks assessed                      General Comments      Exercises        Assessment/Plan    PT Assessment Patent does not need any further PT  services  PT Diagnosis Acute pain   PT Problem List    PT Treatment Interventions     PT Goals (Current goals can be found in the Care Plan section) Acute Rehab PT Goals Patient Stated Goal: pt likes to fish PT Goal Formulation: with patient Time For Goal Achievement: 09/12/15    Frequency  6x/week   Barriers to discharge        Co-evaluation PT/OT/SLP Co-Evaluation/Treatment: Yes Reason for Co-Treatment: For patient/therapist safety PT goals addressed during session: Mobility/safety with mobility;Proper use of DME         End of Session   Activity Tolerance: Patient tolerated treatment well Patient left: in chair;with call bell/phone within reach;with family/visitor present Nurse Communication: Mobility status    Functional Assessment Tool Used: clinical judgement Functional Limitation: Mobility: Walking and moving around Mobility: Walking and Moving Around Current Status [id number]): At least 20 percent but less than 40 percent impaired, limited or restricted Mobility: Walking and Moving Around Goal Status [phone]): At least 1 percent but less than 20 percent impaired, limited or restricted d    Time: 0905-0950 PT Time Calculation (min) (ACUTE ONLY): 45 min   Charges:   PT Evaluation $PT Eval Low Complexity: 1 Procedure PT Treatments $Gait Training: 8-22 mins   PT G Codes:   PT G-Codes **NOT FOR INPATIENT CLASS** Functional Assessment Tool Used: clinical judgement Functional Limitation: Mobility: Walking and moving around Mobility: Walking and Moving Around Current Status [id number]): At least 20 percent but less than 40 percent impaired, limited or restricted Mobility: Walking and Moving Around Goal Status [phone]): At least 1 percent but less than 20 percent impaired, limited or restricted     Philomena Doheny 09/12/2015, 10:04 AM [phone]

## 2015-09-12 NOTE — Progress Notes (Signed)
[facility]  Received referral for rolling walker and commode.  We are not able to accept the referral due to not being in network with [employer] worker's comp.  Informed case manager that I think [employer] may be in network with this worker's comp company.  Linward Headland 09/12/2015, 11:25 AM

## 2015-11-12 ENCOUNTER — Emergency Department (HOSPITAL_COMMUNITY)
Admission: EM | Admit: 2015-11-12 | Discharge: 2015-11-12 | Disposition: A | Discharge status: Home / Self Care | Payer: Self-pay | Attending: Emergency Medicine | Admitting: Emergency Medicine

## 2015-11-12 ENCOUNTER — Emergency Department (HOSPITAL_COMMUNITY): Payer: Self-pay

## 2015-11-12 ENCOUNTER — Encounter (HOSPITAL_COMMUNITY): Payer: Self-pay

## 2015-11-12 DIAGNOSIS — R109 Unspecified abdominal pain: Secondary | ICD-10-CM

## 2015-11-12 DIAGNOSIS — Z8739 Personal history of other diseases of the musculoskeletal system and connective tissue: Secondary | ICD-10-CM | POA: Insufficient documentation

## 2015-11-12 DIAGNOSIS — Z79899 Other long term (current) drug therapy: Secondary | ICD-10-CM | POA: Insufficient documentation

## 2015-11-12 DIAGNOSIS — F419 Anxiety disorder, unspecified: Secondary | ICD-10-CM | POA: Insufficient documentation

## 2015-11-12 DIAGNOSIS — Z87891 Personal history of nicotine dependence: Secondary | ICD-10-CM | POA: Insufficient documentation

## 2015-11-12 DIAGNOSIS — R1032 Left lower quadrant pain: Secondary | ICD-10-CM | POA: Insufficient documentation

## 2015-11-12 DIAGNOSIS — R1012 Left upper quadrant pain: Secondary | ICD-10-CM | POA: Insufficient documentation

## 2015-11-12 LAB — CBC
HCT: 44.5 % (ref 39.0–52.0)
HEMOGLOBIN: 14.8 g/dL (ref 13.0–17.0)
MCH: 30.3 pg (ref 26.0–34.0)
MCHC: 33.3 g/dL (ref 30.0–36.0)
MCV: 91.2 fL (ref 78.0–100.0)
PLATELETS: 268 10*3/uL (ref 150–400)
RBC: 4.88 MIL/uL (ref 4.22–5.81)
RDW: 13.1 % (ref 11.5–15.5)
WBC: 8.3 10*3/uL (ref 4.0–10.5)

## 2015-11-12 LAB — COMPREHENSIVE METABOLIC PANEL
ALK PHOS: 54 U/L (ref 38–126)
ALT: 68 U/L — AB (ref 17–63)
ANION GAP: 7 (ref 5–15)
AST: 32 U/L (ref 15–41)
Albumin: 4.4 g/dL (ref 3.5–5.0)
BILIRUBIN TOTAL: 0.8 mg/dL (ref 0.3–1.2)
BUN: 14 mg/dL (ref 6–20)
CALCIUM: 9.1 mg/dL (ref 8.9–10.3)
CO2: 23 mmol/L (ref 22–32)
CREATININE: 1.03 mg/dL (ref 0.61–1.24)
Chloride: 106 mmol/L (ref 101–111)
GFR calc non Af Amer: >60 mL/min (ref >60)
Glucose, Bld: 101 mg/dL — ABNORMAL HIGH (ref 65–99)
Potassium: 3.9 mmol/L (ref 3.5–5.1)
Sodium: 136 mmol/L (ref 135–145)
TOTAL PROTEIN: 6.9 g/dL (ref 6.5–8.1)

## 2015-11-12 LAB — URINALYSIS, ROUTINE W REFLEX MICROSCOPIC
BILIRUBIN URINE: NEGATIVE
Glucose, UA: NEGATIVE mg/dL
Hgb urine dipstick: NEGATIVE
Ketones, ur: NEGATIVE mg/dL
Leukocytes, UA: NEGATIVE
NITRITE: NEGATIVE
PROTEIN: NEGATIVE mg/dL
SPECIFIC GRAVITY, URINE: 1.01 (ref 1.005–1.030)
pH: 5.5 (ref 5.0–8.0)

## 2015-11-12 LAB — LIPASE, BLOOD: Lipase: 47 U/L (ref 11–51)

## 2015-11-12 MED ORDER — SUCRALFATE 1 G PO TABS: 1.0000 g | ORAL_TABLET | Freq: Three times a day (TID) | ORAL | Status: DC

## 2015-11-12 MED ORDER — IOPAMIDOL (ISOVUE-300) INJECTION 61%
INTRAVENOUS | Status: AC
  Administered 2015-11-12: 100 mL
  Filled 2015-11-12: qty 100

## 2015-11-12 MED ORDER — DICYCLOMINE HCL 20 MG PO TABS: 20.0000 mg | ORAL_TABLET | Freq: Two times a day (BID) | ORAL | Status: DC

## 2015-11-12 MED ORDER — DICYCLOMINE HCL 10 MG PO CAPS
10.0000 mg | ORAL_CAPSULE | Freq: Once | ORAL | Status: AC
Start: 2015-11-12 — End: 2015-11-12
  Administered 2015-11-12: 10 mg via ORAL
  Filled 2015-11-12: qty 1

## 2015-11-12 MED ORDER — ACETAMINOPHEN 325 MG PO TABS
650.0000 mg | ORAL_TABLET | Freq: Once | ORAL | Status: AC
  Administered 2015-11-12: 650 mg via ORAL
  Filled 2015-11-12: qty 2

## 2015-11-12 NOTE — ED Notes (Signed)
Pt stable, ambulatory, states understanding of discharge instructions 

## 2015-11-12 NOTE — ED Notes (Signed)
Pt. Reports that he started Gabapentin  For his back and about 1 week after taking this medication he began to have abdominal burning and decreased appetite.  Pt. Denies any n/v/d denies any urinary symptoms.  Pt. Stopped taking Gabapentin and he continues to have abdominal burning and irritations.   Skin is warm  And dry. Alertand oriented X4

## 2015-11-12 NOTE — Discharge Instructions (Signed)
Dolor abdominal en adultos °(Abdominal Pain, Adult) °El dolor puede tener muchas causas. Normalmente la causa del dolor abdominal no es una enfermedad y mejorará sin tratamiento. Frecuentemente puede controlarse y tratarse en casa. Su médico le realizará un examen físico y posiblemente solicite análisis de sangre y radiografías para ayudar a determinar la gravedad de su dolor. Sin embargo, en muchos casos, debe transcurrir más tiempo antes de que se pueda encontrar una causa evidente del dolor. Antes de llegar a ese punto, es posible que su médico no sepa si necesita más pruebas o un tratamiento más profundo. °INSTRUCCIONES PARA EL CUIDADO EN EL HOGAR  °Esté atento al dolor para ver si hay cambios. Las siguientes indicaciones ayudarán a aliviar cualquier molestia que pueda sentir: °· Tome solo medicamentos de venta libre o recetados, según las indicaciones del médico. °· No tome laxantes a menos que se lo haya indicado su médico. °· Pruebe con una dieta líquida absoluta (caldo, té o agua) según se lo indique su médico. Introduzca gradualmente una dieta normal, según su tolerancia. °SOLICITE ATENCIÓN MÉDICA SI: °· Tiene dolor abdominal sin explicación. °· Tiene dolor abdominal relacionado con náuseas o diarrea. °· Tiene dolor cuando orina o defeca. °· Experimenta dolor abdominal que lo despierta de noche. °· Tiene dolor abdominal que empeora o mejora cuando come alimentos. °· Tiene dolor abdominal que empeora cuando come alimentos grasosos. °· Tiene fiebre. °SOLICITE ATENCIÓN MÉDICA DE INMEDIATO SI:  °· El dolor no desaparece en un plazo máximo de 2 horas. °· No deja de (vomitar). °· El dolor se siente solo en partes del abdomen, como el lado derecho o la parte inferior izquierda del abdomen. °· Evacúa materia fecal sanguinolenta o negra, de aspecto alquitranado. °ASEGÚRESE DE QUE: °· Comprende estas instrucciones. °· Controlará su afección. °· Recibirá ayuda de inmediato si no mejora o si empeora. °  °Esta  información no tiene como fin reemplazar el consejo del médico. Asegúrese de hacerle al médico cualquier pregunta que tenga. °  °Document Released: 05/25/2005 Document Revised: 06/15/2014 °Elsevier Interactive Patient Education ©2016 [employer] Inc[employer] ° °

## 2015-11-12 NOTE — ED Provider Notes (Signed)
CSN: [id number]     Arrival date & time 11/12/15  1328 History   First MD Initiated Contact with Patient 11/12/15 1600     Chief Complaint  Patient presents with  . Abdominal Pain    The history is limited by a language barrier. A language interpreter was used.     Wayne Miller is an 35 y.o. male with no significant PMH who presents to the ED for evaluation of abdominal pain. He states he started taking Gabapentin for his back about two weeks ago. He states that after the first six days he started experiencing diffuse left sided burning abdominal pain. He states it feels like "inflammation." he states that since then he has discontinued his gabapentin but states his pain persists. The pain does not radiate. He states it is constant. Denies associated nausea, vomiting, or diarrhea. Denies fever, chills, urinary symptoms. He states his stomach feels irritated. He has not tried anything else for relief of his symptoms. Denies EtOH or other drug use.  Past Medical History  Diagnosis Date  . Herniated nucleus pulposis of lumbosacral region    Past Surgical History  Procedure Laterality Date  . Knee arthroscopy Left     left knee scope "tear repair"  . Lumbar laminectomy/decompression microdiscectomy Right 09/11/2015    Procedure: MICRO LUMBAR DECOMPRESSION OF RIGHT L4-5 AND L5-S1 AND RIGHT FORAMINOTOMY L5 (2 LEVELS);  Surgeon: Susa Day, MD;  Location: [facility];  Service: Orthopedics;  Laterality: Right;   No family history on file. Social History  Substance Use Topics  . Smoking status: Former Smoker    Types: Cigarettes    Quit date: 08/29/2013  . Smokeless tobacco: Never Used  . Alcohol Use: No     Comment: past -not now    Review of Systems  All other systems reviewed and are negative.     Allergies  Ibuprofen  Home Medications   Prior to Admission medications   Medication Sig Start Date End Date Taking? Authorizing Provider  acetaminophen (TYLENOL) 500 MG tablet Take  1,000 mg by mouth every 6 (six) hours as needed for moderate pain.    Historical Provider, MD  docusate sodium (COLACE) 100 MG capsule Take 1 capsule (100 mg total) by mouth 2 (two) times daily as needed for mild constipation. 09/11/15   Susa Day, MD  ipratropium (ATROVENT) 0.06 % nasal spray Place 2 sprays into both nostrils 4 (four) times daily. 07/10/15   Billy Fischer, MD  methocarbamol (ROBAXIN) 500 MG tablet Take 1 tablet (500 mg total) by mouth every 6 (six) hours as needed for muscle spasms. 09/11/15   Susa Day, MD  naproxen (NAPROSYN) 500 MG tablet Take 1 tablet (500 mg total) by mouth 2 (two) times daily with a meal. May resume 5 days post-op 09/12/15   Cecilie Kicks, PA-C  oxyCODONE-acetaminophen (PERCOCET) 5-325 MG tablet Take 1-2 tablets by mouth every 4 (four) hours as needed for severe pain. 09/11/15   Susa Day, MD  terbinafine (LAMISIL) 250 MG tablet Take 250 mg by mouth daily. For 3 months     Historical Provider, MD   BP 135/96 mmHg  Pulse 64  Temp(Src) 98.5 F (36.9 C) (Oral)  Resp 18  SpO2 99% Physical Exam  Constitutional: He is oriented to person, place, and time.  HENT:  Right Ear: External ear normal.  Left Ear: External ear normal.  Nose: Nose normal.  Mouth/Throat: Oropharynx is clear and moist. No oropharyngeal exudate.  Eyes: Conjunctivae and  EOM are normal. Pupils are equal, round, and reactive to light.  Neck: Normal range of motion. Neck supple.  Cardiovascular: Normal rate, regular rhythm, normal heart sounds and intact distal pulses.   Pulmonary/Chest: Effort normal and breath sounds normal. No respiratory distress. He has no wheezes. He exhibits no tenderness.  Abdominal: Soft. Bowel sounds are normal. He exhibits no distension. There is no rebound and no guarding.  Left abdomen diffusely tender to palpation. LUQ > LLQ. No rebound or guarding. Right side nontender. No CVA tenderness  Musculoskeletal: He exhibits no edema.  Neurological: He is  alert and oriented to person, place, and time. No cranial nerve deficit.  Skin: Skin is warm and dry.  Psychiatric: His mood appears anxious.  Nursing note and vitals reviewed.   ED Course  Procedures (including critical care time) Labs Review Labs Reviewed  COMPREHENSIVE METABOLIC PANEL - Abnormal; Notable for the following:    Glucose, Bld 101 (*)    ALT 68 (*)    All other components within normal limits  LIPASE, BLOOD  CBC  URINALYSIS, ROUTINE W REFLEX MICROSCOPIC (NOT AT [facility])    Imaging Review Ct Abdomen Pelvis W Contrast  11/12/2015  CLINICAL DATA:  Abdominal pain and burning. EXAM: CT ABDOMEN AND PELVIS WITH CONTRAST TECHNIQUE: Multidetector CT imaging of the abdomen and pelvis was performed using the standard protocol following bolus administration of intravenous contrast. Patient had difficulty breathing following contrast injection described as panic attack. Delayed imaging was performed after patient collect himself. CONTRAST:  1 ISOVUE-300 IOPAMIDOL (ISOVUE-300) INJECTION 61% COMPARISON:  CT 02/22/2001 FINDINGS: Lower chest: Lung bases are clear. Hepatobiliary: No focal hepatic lesion. No biliary duct dilatation. Gallbladder is normal. Common bile duct is normal. Pancreas: Pancreas is normal. No ductal dilatation. No pancreatic inflammation. Spleen: Normal spleen Adrenals/urinary tract: Adrenal glands and kidneys are normal. The ureters and bladder normal. Stomach/Bowel: Stomach, small bowel, appendix, and cecum are normal. Several diverticula descending colon sigmoid colon without acute inflammation. Rectum normal. Vascular/Lymphatic: Abdominal aorta is normal caliber. There is no retroperitoneal or periportal lymphadenopathy. No pelvic lymphadenopathy. Reproductive: Prostate normal. Other: No free fluid. Musculoskeletal: No aggressive osseous lesion. IMPRESSION: 1. No acute findings in the abdomen or pelvis. 2. Normal appendix. 3. Mild diverticulosis without diverticulitis.  Electronically Signed   By: Suzy Bouchard M.D.   On: 11/12/2015 18:15   I have personally reviewed and evaluated these images and lab results as part of my medical decision-making.   EKG Interpretation None      MDM   Final diagnoses:  Left sided abdominal pain    Pt with some diffuse left sided abdominal pain but no periotneal signs. He is otherwise well appearing, afebrile, with unremarkable labs aside from a mildly elevated AST. I did discuss with him that his symptoms are likely a continued reaction to his gabapentin given the pain started only after trying gabapentin for the first time with no other laboratory or exam findings. However, pt is requesting a CT scan of his abdomen today. I discussed with him conservative management with trial of outpatient medication and f/u but he would feel more comfortable with a CT today. Given his tenderness and pain continued even after discontinuation of gabapentin I think this is reasonable. Will obtain CT abd/pelvis in the ED.   CT negative for acute findings. I again discussed with pt that his pain could still be due to gabapentin which I advised to avoid until he sees PCP for follow up. Will trial bentyl/carafate as  pt complains of spasming burning pain as well. He is nontoxic appearing, afebrile and hemodynamically stable, and tolerating PO. ER return precautions given.   Anne Ng, PA-C 11/12/15 [place], MD 11/18/15 [phone]

## 2018-05-11 ENCOUNTER — Ambulatory Visit: Payer: Self-pay | Admitting: Family Medicine

## 2018-05-11 ENCOUNTER — Encounter: Payer: Self-pay | Admitting: Family Medicine

## 2018-05-11 VITALS — BP 122/86 | HR 58 | Temp 97.8°F | Ht 68.5 in | Wt 226.4 lb

## 2018-05-11 DIAGNOSIS — Z0000 Encounter for general adult medical examination without abnormal findings: Secondary | ICD-10-CM

## 2018-05-11 DIAGNOSIS — Z23 Encounter for immunization: Secondary | ICD-10-CM | POA: Diagnosis not present

## 2018-05-11 DIAGNOSIS — F5101 Primary insomnia: Secondary | ICD-10-CM

## 2018-05-11 DIAGNOSIS — Z114 Encounter for screening for human immunodeficiency virus [HIV]: Secondary | ICD-10-CM

## 2018-05-11 MED ORDER — TRAZODONE HCL 50 MG PO TABS: 25.0000 mg | ORAL_TABLET | Freq: Every evening | ORAL | 3 refills | Status: DC | PRN

## 2018-05-11 NOTE — Patient Instructions (Signed)
Starting medication for sleep. Will take at night 30 minutes-1 hour before bed. Start off taking 1/2 tab and can increase to one full tab. Also referred you to sleep doctor.   Insomnio (Insomnia) El insomnio es un trastorno del sueo que causa dificultades para conciliar el sueo o para [place]. Puede producir cansancio (fatiga), falta de energa, dificultad para concentrarse, cambios en el estado de nimo y mal rendimiento escolar o laboral. Hay tres formas diferentes de clasificar el insomnio:  Dificultad para conciliar el sueo.  Dificultad para mantener el sueo.  Despertar muy precoz por la maana. Cualquier tipo de insomnio puede ser a Barrister's clerk (crnico) o a Control and instrumentation engineer (agudo). Ambos son frecuentes. Generalmente, el insomnio a corto plazo dura tres meses o menos tiempo. El crnico ocurre al menos tres veces por semana durante ms de tres meses. CAUSAS El insomnio puede deberse a otra afeccin, situacin o sustancia, por ejemplo:  Ansiedad.  Algunos medicamentos.  Enfermedad por reflujo gastroesofgico (ERGE) u otras enfermedades gastrointestinales.  Asma y otras enfermedades respiratorias.  Sndrome de las piernas inquietas, apnea del sueo u otros trastornos del sueo.  Dolor crnico.  Menopausia, que puede incluir calores repentinos.  Ictus.  Consumo excesivo de alcohol, tabaco u drogas ilegales.  Depresin.  Cafena.  Trastornos neurolgicos, como enfermedad de Alzheimer.  Hiperactividad tiroidea (hipertiroidismo). Es posible que la causa del insomnio no se conozca. FACTORES DE RIESGO Los factores de riesgo de tener insomnio incluyen lo siguiente:  El sexo. La mujeres se ven ms afectadas que los hombres.  La edad. El insomnio es ms frecuente a medida que una persona envejece.  El estrs. Esto puede incluir su vida profesional o personal.  Los ingresos. El insomnio es ms frecuente en las personas cuyos ingresos son ms bajos.  La falta de  actividad fsica.  Los horarios de trabajo irregulares o los turnos nocturnos.  Los viajes a lugares de diferentes zonas horarias. SIGNOS Y SNTOMAS Si tiene insomnio, el sntoma principal es la dificultad para conciliar el sueo o mantenerlo. Esto puede derivar en otros sntomas, por ejemplo:  Sentirse fatigado.  Ponerse nervioso por [employer] irse a dormir.  No sentirse descansado por la maana.  Tener dificultad para concentrarse.  Sentirse irritable, ansioso o deprimido. TRATAMIENTO El tratamiento para el insomnio depende de la causa. Si se debe a una enfermedad preexistente, el tratamiento se centrar en el abordaje de la enfermedad. El tratamiento tambin puede incluir lo siguiente:  Medicamentos que lo ayuden a dormir.  Asesoramiento psicolgico o terapia.  Cambios en el estilo de vida. [place] los medicamentos solamente como se lo haya indicado el mdico.  Establezca horarios habituales para dormir y Clinical cytogeneticist. No tome siestas.  Lleve un registro del sueo ya que podra ser de utilidad para que usted y a su mdico puedan determinar qu podra estar causndole insomnio. Incluya lo siguiente: ? Cundo duerme. ? Cundo se despierta durante la noche. ? Qu tan bien duerme. ? Qu tan relajado se siente al da siguiente. ? Cualquier efecto secundario de los [employer] toma. ? Lo que usted come y bebe.  Convierta a su habitacin en un lugar cmodo donde sea fcil conciliar el sueo: ? Coloque persianas o cortinas especiales oscuras que impidan la entrada de la luz del exterior. ? Para bloquear los ruidos, use un aparato que reproduce sonidos ambientales o relajantes de fondo. ? Mantenga baja la temperatura.  Haga ejercicio regularmente como se lo haya indicado  el mdico. No haga ejercicio justo antes de la hora de [place].  Utilice tcnicas de relajacin para controlar el estrs. Pdale al mdico que le sugiera algunas  tcnicas que sean adecuadas para usted. Estos pueden incluir lo siguiente: ? Ejercicios de respiracin. ? Rutinas para aliviar la tensin muscular. ? Visualizacin de escenas apacibles.  Disminucin del consumo de alcohol, bebidas con cafena y cigarrillos, especialmente cerca de la hora de [place], ya que pueden perturbarle el sueo.  No coma en exceso ni consuma comidas picantes justo antes de la hora de [place]. Esto puede causarle molestias digestivas y dificultades para dormir.  Limite el uso de pantallas antes de la hora de [place]. Esto incluye lo siguiente: ? Mirar televisin. ? Usar el telfono inteligente, la tableta y la computadora.  Siga una rutina. Esto puede ayudarlo a conciliar el sueo ms rpidamente. Intente hacer una actividad tranquila, cepillarse los dientes e irse a la cama a la misma hora todas las noches.  Levntese de la cama si sigue despierto despus de 68minutos de haber intentado dormirse. [place] luces, pero intente leer o hacer una actividad tranquila. Cuando tenga sueo, regrese a Futures trader.  Conduzca con cuidado. No conduzca si est muy somnoliento.  Concurra a todas las visitas de control, segn le indique su mdico. Esto es importante. SOLICITE ATENCIN MDICA SI:  Est cansado durante el da o tiene dificultades en su rutina diaria debido a la somnolencia.  Sigue teniendo problemas para dormir o Press photographer. SOLICITE ATENCIN MDICA DE INMEDIATO SI:  Tiene pensamientos serios acerca de lastimarse a usted mismo o daar a Nurse, children's. Esta informacin no tiene Marine scientist el consejo del mdico. Asegrese de hacerle al mdico cualquier pregunta que tenga. Document Released: 05/25/2005 Document Revised: 06/15/2014 Document Reviewed: 02/23/2014 Elsevier Interactive Patient Education  [employer].

## 2018-05-11 NOTE — Progress Notes (Deleted)
Patient: Wayne Miller MRN: [id number] DOB: [birth year] PCP: Orma Flaming, MD     Subjective:  No chief complaint on file.   HPI: The patient is a 37 y.o. male who presents today for annual exam. {He/she (caps):30048} denies any changes to past medical history. There have been no recent hospitalizations. They {Actions; are/are not:16769} following a well balanced diet and exercise plan. Weight has been {trend:16658}. No complaints today.   Immunization History  Administered Date(s) Administered  . Influenza Whole 03/07/2010   Colonoscopy: Mammogram:  Pap smear:  PSA:   Review of Systems  Allergies Patient is allergic to ibuprofen.  Past Medical History Patient  has a past medical history of Herniated nucleus pulposis of lumbosacral region.  Surgical History Patient  has a past surgical history that includes Knee arthroscopy (Left) and Lumbar laminectomy/decompression microdiscectomy (Right, 09/11/2015).  Family History Pateint's family history is not on file.  Social History Patient  reports that he quit smoking about 4 years ago. His smoking use included cigarettes. He has never used smokeless tobacco. He reports that he does not drink alcohol or use drugs.    Objective: There were no vitals filed for this visit.  There is no height or weight on file to calculate BMI.  Physical Exam     Assessment/plan:   No problem-specific Assessment & Plan notes found for this encounter.    No follow-ups on file.     Orma Flaming, MD [place]  05/11/2018

## 2018-05-11 NOTE — Progress Notes (Signed)
Patient: Wayne Miller MRN: [id number] DOB: 1980-09-11 PCP: Orma Flaming, MD     Subjective:  Chief Complaint  Patient presents with  . Establish Care  . Insomnia    HPI: The patient is a 37 y.o. male who presents today for establishing care and insomnia. He has no other medical problems that is aware of. He has family history of diabetes in both his mother and father. His mother also has high cholesterol and HTN. He has not been hospitalized in the past year. Not exercising and diet is okay.   Insomnia: he started to have this 2 years ago after his back surgery. He tries to go to bed around 10pm to 11pm. He has problems falling asleep and he can not relax. He does not sleep at all. He gets up at 4Am to go to work. 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 He does not take a nap during the day. He drinks no caffeine. He does watch TV before bed. No exercise outside of work. His diet he states is healthy. He is unsure if he snores. He doesn't think he snores. He tried melatonin and benadryl over the counter and this did not work for him. He tried tylenol PM as well. He keeps telling me his body can not relax and he thinks something is wrong in his head. He would like to see a sleep doctor.    Tdap: today Flu: today Hiv: today   Review of Systems  Constitutional: Positive for fatigue. Negative for chills and fever.  HENT: Negative for dental problem, ear pain, hearing loss and trouble swallowing.   Eyes: Negative for visual disturbance.  Respiratory: Negative for cough, chest tightness and shortness of breath.   Cardiovascular: Negative for chest pain, palpitations and leg swelling.  Gastrointestinal: Negative for abdominal pain, blood in stool, diarrhea and nausea.  Endocrine: Negative for cold intolerance, polydipsia, polyphagia and polyuria.  Genitourinary: Negative for dysuria and hematuria.  Musculoskeletal: Positive for back pain. Negative for arthralgias and neck pain.  Skin: Negative.   Negative for rash.  Neurological: Negative for dizziness and headaches.  Psychiatric/Behavioral: Positive for sleep disturbance. Negative for dysphoric mood. The patient is not nervous/anxious.     Allergies Patient is allergic to ibuprofen.  Past Medical History Patient  has a past medical history of Blood in stool, GERD (gastroesophageal reflux disease), Herniated nucleus pulposis of lumbosacral region, Hypertension, and Multiple gastric ulcers.  Surgical History Patient  has a past surgical history that includes Knee arthroscopy (Left) and Lumbar laminectomy/decompression microdiscectomy (Right, 09/11/2015).  Family History Pateint's family history includes Alcohol abuse in his maternal grandfather; Arthritis in his father, maternal grandmother, mother, and paternal grandmother; Birth defects in his brother; Depression in his father; Diabetes in his father and mother; Hyperlipidemia in his mother; Hypertension in his father, maternal grandmother, and mother; Kidney disease in his father.  Social History Patient  reports that he quit smoking about 4 years ago. His smoking use included cigarettes. He has never used smokeless tobacco. He reports that he drinks alcohol. He reports that he does not use drugs.    Objective: Vitals:   05/11/18 1428  BP: 122/86  Pulse: (!) 58  Temp: 97.8 F (36.6 C)  TempSrc: Oral  SpO2: 97%  Weight: 226 lb 6.4 oz (102.7 kg)  Height: 5' 8.5" (1.74 m)    Body mass index is 33.92 kg/m.  Physical Exam  Constitutional: He is oriented to person, place, and time. He appears well-developed and well-nourished.  HENT:  Right Ear: External ear normal.  Left Ear: External ear normal.  Mouth/Throat: Oropharynx is clear and moist.  Eyes: Pupils are equal, round, and reactive to light. Conjunctivae and EOM are normal.  Neck: Normal range of motion. Neck supple. No thyromegaly present.  Cardiovascular: Normal rate, regular rhythm, normal heart sounds and intact  distal pulses.  No murmur heard. Pulmonary/Chest: Effort normal and breath sounds normal.  Abdominal: Soft. Bowel sounds are normal. He exhibits no distension. There is no tenderness.  Lymphadenopathy:    He has no cervical adenopathy.  Neurological: He is alert and oriented to person, place, and time. He displays normal reflexes. No cranial nerve deficit. Coordination normal.  Skin: Skin is warm and dry. No rash noted.  Psychiatric: He has a normal mood and affect. His behavior is normal.  Vitals reviewed.  Depression screen PHQ 2/9 05/11/2018  Decreased Interest 0  Down, Depressed, Hopeless 2  PHQ - 2 Score 2  Altered sleeping 3  Tired, decreased energy 0  Change in appetite 0  Feeling bad or failure about yourself  1  Trouble concentrating 0  Moving slowly or fidgety/restless 0  Suicidal thoughts 0  PHQ-9 Score 6  Difficult doing work/chores Not difficult at all       Assessment/plan: 1. Annual physical exam Routine lab work today. Flu/tdap/screening HIV. Discussed importance of diet/exercise. F/u in one year or as needed.  Patient counseling [x]    Nutrition: Stressed importance of moderation in sodium/caffeine intake, saturated fat and cholesterol, caloric balance, sufficient intake of fresh fruits, vegetables, fiber, calcium, iron, and 1 mg of folate supplement per day (for females capable of pregnancy).  [x]    Stressed the importance of regular exercise.   []    Substance Abuse: Discussed cessation/primary prevention of tobacco, alcohol, or other drug use; driving or other dangerous activities under the influence; availability of treatment for abuse.   [x]    Injury prevention: Discussed safety belts, safety helmets, smoke detector, smoking near bedding or upholstery.   [x]    Sexuality: Discussed sexually transmitted diseases, partner selection, use of condoms, avoidance of unintended pregnancy  and contraceptive alternatives.  [x]    Dental health: Discussed importance of  regular tooth brushing, flossing, and dental visits.  [x]    Health maintenance and immunizations reviewed. Please refer to Health maintenance section.    - Comprehensive metabolic panel - CBC - TSH - Lipid panel  2. Primary insomnia Practicing decent sleep hygiene. Discussed no screen time 30 minutes before bed and bed for sex and sleep only. Would also like him to start exercising outside of work. Will do trial of trazodone and send to sleep doc as requested. See him back in 3 months to see how he is doing on trazodone. Can cancel this if already being followed by sleep doctor.  - VITAMIN D 25 Hydroxy (Vit-D Deficiency, Fractures) - Ambulatory referral to Sleep Studies  3. Encounter for screening for HIV  - HIV Antibody (routine testing w rflx)  4. Need for prophylactic vaccination with combined diphtheria-tetanus-pertussis (DTP) vaccine  - Tdap vaccine greater than or equal to 7yo IM  5. Need for prophylactic vaccination and inoculation against influenza  - Flu Vaccine QUAD 6+ mos PF IM (Fluarix Quad PF)   Return in about 3 months (around 08/10/2018) for sleep .   Orma Flaming, MD [place]   05/12/2018

## 2018-05-12 ENCOUNTER — Encounter: Payer: Self-pay | Admitting: Family Medicine

## 2018-05-12 LAB — COMPREHENSIVE METABOLIC PANEL
ALT: 18 U/L (ref 0–53)
AST: 15 U/L (ref 0–37)
Albumin: 4.7 g/dL (ref 3.5–5.2)
Alkaline Phosphatase: 52 U/L (ref 39–117)
BUN: 18 mg/dL (ref 6–23)
CHLORIDE: 105 meq/L (ref 96–112)
CO2: 27 meq/L (ref 19–32)
Calcium: 9.7 mg/dL (ref 8.4–10.5)
Creatinine, Ser: 0.99 mg/dL (ref 0.40–1.50)
GFR: 89.38 mL/min (ref >60.00)
Glucose, Bld: 97 mg/dL (ref 70–99)
POTASSIUM: 4.6 meq/L (ref 3.5–5.1)
SODIUM: 139 meq/L (ref 135–145)
Total Bilirubin: 0.7 mg/dL (ref 0.2–1.2)
Total Protein: 7.2 g/dL (ref 6.0–8.3)

## 2018-05-12 LAB — CBC
HEMATOCRIT: 45.9 % (ref 39.0–52.0)
HEMOGLOBIN: 15.4 g/dL (ref 13.0–17.0)
MCHC: 33.5 g/dL (ref 30.0–36.0)
MCV: 91.4 fl (ref 78.0–100.0)
Platelets: 251 10*3/uL (ref 150.0–400.0)
RBC: 5.02 Mil/uL (ref 4.22–5.81)
RDW: 14 % (ref 11.5–15.5)
WBC: 7.5 10*3/uL (ref 4.0–10.5)

## 2018-05-12 LAB — LIPID PANEL
CHOL/HDL RATIO: 5
Cholesterol: 191 mg/dL (ref 0–200)
HDL: 37.8 mg/dL — ABNORMAL LOW (ref >39.00)
LDL Cholesterol: 118 mg/dL — ABNORMAL HIGH (ref 0–99)
NONHDL: 152.91
Triglycerides: 173 mg/dL — ABNORMAL HIGH (ref 0.0–149.0)
VLDL: 34.6 mg/dL (ref 0.0–40.0)

## 2018-05-12 LAB — VITAMIN D 25 HYDROXY (VIT D DEFICIENCY, FRACTURES): VITD: 15.85 ng/mL — AB (ref 30.00–100.00)

## 2018-05-12 LAB — TSH: TSH: 0.54 u[IU]/mL (ref 0.35–4.50)

## 2018-05-12 LAB — HIV ANTIBODY (ROUTINE TESTING W REFLEX): HIV 1&2 Ab, 4th Generation: NONREACTIVE

## 2018-05-13 ENCOUNTER — Other Ambulatory Visit: Payer: Self-pay | Admitting: Family Medicine

## 2018-05-13 DIAGNOSIS — E559 Vitamin D deficiency, unspecified: Secondary | ICD-10-CM

## 2018-05-13 MED ORDER — VITAMIN D (ERGOCALCIFEROL) 1.25 MG (50000 UNIT) PO CAPS: ORAL_CAPSULE | ORAL | 0 refills | Status: DC

## 2018-05-16 ENCOUNTER — Telehealth: Payer: Self-pay | Admitting: Family Medicine

## 2018-05-16 NOTE — Telephone Encounter (Signed)
Copied from [place] [id number]. Topic: Quick Communication - Lab Results (Clinic Use ONLY) >> May 13, 2018  5:07 PM Zellmer, Clearnce Sorrel, CMA wrote: Called patient to inform them of 12/5 lab results. When patient returns call, triage nurse may disclose results. >> May 16, 2018  4:22 PM Bea Graff, NT wrote: Pts wife returning call to recieve pts lab results.

## 2018-05-17 ENCOUNTER — Telehealth: Payer: Self-pay | Admitting: Family Medicine

## 2018-05-17 NOTE — Telephone Encounter (Signed)
Charted in result notes. 

## 2018-05-17 NOTE — Telephone Encounter (Signed)
Wife called stating the Trazodone is not working for his sleep. He is taking 50 mg total. Wife stated to call if appointment needed.

## 2018-05-18 NOTE — Telephone Encounter (Signed)
Does he have his sleep doctor appointment set up yet?

## 2018-05-18 NOTE — Telephone Encounter (Signed)
Pls see msg and advise.   

## 2018-05-19 NOTE — Telephone Encounter (Signed)
Attempted to contact patient's wife, Margart Sickles, phone line was busy.  Will try again later.

## 2018-05-23 NOTE — Telephone Encounter (Signed)
Called patient and left voicemail msg requesting a  call back via [employer] ID # [phone].

## 2018-07-04 ENCOUNTER — Telehealth: Payer: Self-pay | Admitting: Family Medicine

## 2018-07-04 NOTE — Telephone Encounter (Signed)
Ok to switch. Go ahead and try to get him scheduled.

## 2018-07-04 NOTE — Telephone Encounter (Signed)
Copied from [place] [id number]. Topic: Appointment Scheduling - Transfer of Care >> Jul 04, 2018 10:40 AM Nathanial Millman J wrote: Pt is requesting to transfer FROM: Dr Rogers Blocker  Pt is requesting to transfer TO: Mackie Pai  Reason for requested transfer: this provider speaks [place] to patient's current PCP (transferring FROM).

## 2018-07-04 NOTE — Telephone Encounter (Signed)
See note

## 2018-07-04 NOTE — Telephone Encounter (Signed)
Okay with me 

## 2018-07-04 NOTE — Telephone Encounter (Signed)
Okay to schedule NP appt w/ Percell Miller.

## 2018-07-05 NOTE — Telephone Encounter (Signed)
Pt scheduled on 07-22-2018 with [place] (TOC). Done.

## 2018-07-22 ENCOUNTER — Encounter: Payer: Self-pay | Admitting: Medical

## 2018-07-22 ENCOUNTER — Ambulatory Visit: Payer: BLUE CROSS/BLUE SHIELD | Admitting: Medical

## 2018-07-22 VITALS — BP 126/86 | HR 49 | Temp 98.1°F | Resp 16 | Ht 66.0 in | Wt 220.2 lb

## 2018-07-22 DIAGNOSIS — K219 Gastro-esophageal reflux disease without esophagitis: Secondary | ICD-10-CM

## 2018-07-22 DIAGNOSIS — G4700 Insomnia, unspecified: Secondary | ICD-10-CM | POA: Diagnosis not present

## 2018-07-22 DIAGNOSIS — F419 Anxiety disorder, unspecified: Secondary | ICD-10-CM

## 2018-07-22 MED ORDER — CLONAZEPAM 0.5 MG PO TABS: 0.5000 mg | ORAL_TABLET | Freq: Every day | ORAL | 0 refills | Status: DC

## 2018-07-22 NOTE — Patient Instructions (Addendum)
You do have history of chronic insomnia and it appears that you may have some underlying anxiety.  You have tried various medications in the past without any success.  Will try benzodiazepine called clonazepam to see if this helps you sleep.   You reported no obvious depression today.  But if you do have some mild depression we could give you medication that helps with both depression and anxiety.  Please sign a release form so we can get her medical records/labs that were done recently.  For gerd, can use famotidine/pecid otc  if needed since zantac no longer available.  Follow-up in 2 weeks or as needed.

## 2018-07-22 NOTE — Progress Notes (Signed)
Subjective:    Patient ID: Wayne Miller, male    DOB: [birth year], 38 y.o.   MRN: [id number]  HPI  Pt in for first time with me.  Transfer of care.  Pt has 2 years without ability to sleep much. Pt wife thinks he is stressed. He denies tension. He thinks something is wrong with his body. But does not have any particular signs and symptoms other than anxiety. He denies depression. Wife thinks he may be depressed. 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 Now some pain during the day but not severe recently. Does not have back pain at night.  Pt has tried trazadone in the past up to 2 tabs at night 50-100 mg but does not help him sleep.  Wife states he does not snore at night. Very rare snore at best..  Trazadone does not help much. He has been on gabapentin. Has tried restoril as well. Did not help much. He did not try [place]. Did try melatonin up to 10 mg but did not help.  Not using any caffeine. No alcohol.  Pt has 3 children. He works 40-50 hours a week in [employer]. Stops work at 5 in afternoon. Does not work out.  Pt has gerd. Used to use zantac.   Pt bp is not high today. In past possible whit coat htn.   Review of Systems  Constitutional: Negative for chills, fatigue and fever.  Respiratory: Negative for cough, chest tightness, shortness of breath and wheezing.   Cardiovascular: Negative for chest pain and palpitations.  Gastrointestinal: Negative for abdominal pain.  Musculoskeletal: Negative for back pain.  Skin: Negative for rash.  Neurological: Negative for dizziness, syncope and weakness.  Hematological: Does not bruise/bleed easily.  Psychiatric/Behavioral: Positive for sleep disturbance. Negative for behavioral problems, confusion, dysphoric mood and suicidal ideas. The patient is nervous/anxious.     Past Medical History:  Diagnosis Date    . Blood in stool   . GERD (gastroesophageal reflux disease)   . Herniated nucleus pulposis of lumbosacral region   . Multiple gastric ulcers      Social History   Socioeconomic History  . Marital status: Married    Spouse name: Not on file  . Number of children: Not on file  . Years of education: Not on file  . Highest education level: Not on file  Occupational History  . Not on file  Social Needs  . Financial resource strain: Not on file  . Food insecurity:    Worry: Not on file    Inability: Not on file  . Transportation needs:    Medical: Not on file    Non-medical: Not on file  Tobacco Use  . Smoking status: Former Smoker    Types: Cigarettes    Last attempt to quit: 08/29/2013    Years since quitting: 4.8  . Smokeless tobacco: Never Used  Substance and Sexual Activity  . Alcohol use: Not Currently    Comment: past -not now  . Drug use: No  . Sexual activity: Yes  Lifestyle  . Physical activity:    Days per week: Not on file    Minutes per session: Not on file  . Stress: Not on file  Relationships  . Social connections:    Talks on phone: Not on file    Gets together: Not on file  Attends religious service: Not on file    Active member of club or organization: Not on file    Attends meetings of clubs or organizations: Not on file    Relationship status: Not on file  . Intimate partner violence:    Fear of current or ex partner: Not on file    Emotionally abused: Not on file    Physically abused: Not on file    Forced sexual activity: Not on file  Other Topics Concern  . Not on file  Social History Narrative   ** Merged History Encounter **        Past Surgical History:  Procedure Laterality Date  . KNEE ARTHROSCOPY Left    left knee scope "tear repair"  . LUMBAR LAMINECTOMY/DECOMPRESSION MICRODISCECTOMY Right 09/11/2015   Procedure: MICRO LUMBAR DECOMPRESSION OF RIGHT L4-5 AND L5-S1 AND RIGHT FORAMINOTOMY L5 (2 LEVELS);  Surgeon: Susa Day,  MD;  Location: [facility];  Service: Orthopedics;  Laterality: Right;    Family History  Problem Relation Age of Onset  . Arthritis Mother   . Diabetes Mother   . Hyperlipidemia Mother   . Hypertension Mother   . Arthritis Father   . Depression Father   . Diabetes Father   . Hypertension Father   . Kidney disease Father   . Arthritis Maternal Grandmother   . Hypertension Maternal Grandmother   . Alcohol abuse Maternal Grandfather   . Arthritis Paternal Grandmother   . Birth defects Brother     Allergies  Allergen Reactions  . Zolpidem Nausea And Vomiting  . Ibuprofen Other (See Comments)    G I upset    Current Outpatient Medications on File Prior to Visit  Medication Sig Dispense Refill  . traZODone (DESYREL) 50 MG tablet Take 0.5-1 tablets (25-50 mg total) by mouth at bedtime as needed for sleep. 30 tablet 3  . Vitamin D, Ergocalciferol, (DRISDOL) 1.25 MG (50000 UT) CAPS capsule One capsule by mouth once a week for 12 weeks. Then take 2000IU/day 12 capsule 0   No current facility-administered medications on file prior to visit.     BP 126/86   Pulse (!) 49   Temp 98.1 F (36.7 C) (Oral)   Resp 16   Ht 5\' 6"  (1.676 m)   Wt 220 lb 3.2 oz (99.9 kg)   SpO2 100%   BMI 35.54 kg/m       Objective:   Physical Exam  General Mental Status- Alert. General Appearance- Not in acute distress.   Skin General: Color- Normal Color. Moisture- Normal Moisture.  Neck Carotid Arteries- Normal color. Moisture- Normal Moisture. No carotid bruits. No JVD.  Chest and Lung Exam Auscultation: Breath Sounds:-Normal.  Cardiovascular Auscultation:Rythm- Regular. Murmurs & Other Heart Sounds:Auscultation of the heart reveals- No Murmurs.  Abdomen Inspection:-Inspeection Normal. Palpation/Percussion:Note:No mass. Palpation and Percussion of the abdomen reveal- Non Tender, Non Distended + BS, no rebound or guarding.  Neurologic Cranial Nerve exam:- CN III-XII intact(No  nystagmus), symmetric smile. Strength:- 5/5 equal and symmetric strength both upper and lower extremities.      Assessment & Plan:  You do have history of chronic insomnia and it appears that you may have some underlying anxiety.  You have tried various medications in the past without any success.  Will try benzodiazepine called clonazepam to see if this helps you sleep.   You reported no obvious depression today.  But if you do have some mild depression we could give you medication that helps with both depression  and anxiety.  Please sign a release form so we can get her medical records/labs that were done recently.  For gerd can use famotidine/pepcid otc if needed since zantac no longer available.  Follow-up in 2 weeks or as needed.  Mackie Pai, PA-C

## 2018-08-05 ENCOUNTER — Encounter: Payer: Self-pay | Admitting: Medical

## 2018-08-05 ENCOUNTER — Ambulatory Visit: Payer: BLUE CROSS/BLUE SHIELD | Admitting: Medical

## 2018-08-05 VITALS — BP 126/89 | HR 54 | Temp 97.7°F | Resp 16 | Ht 66.0 in | Wt 223.6 lb

## 2018-08-05 DIAGNOSIS — G4700 Insomnia, unspecified: Secondary | ICD-10-CM | POA: Diagnosis not present

## 2018-08-05 DIAGNOSIS — F419 Anxiety disorder, unspecified: Secondary | ICD-10-CM | POA: Diagnosis not present

## 2018-08-05 MED ORDER — CLONAZEPAM 1 MG PO TABS: 1.0000 mg | ORAL_TABLET | Freq: Two times a day (BID) | ORAL | 0 refills | Status: DC | PRN

## 2018-08-05 NOTE — Patient Instructions (Signed)
You do have both anxiety which appears to be affecting your ability to sleep.  You have tried various other medications for insomnia and did not sleep well.  You report with low-dose clonazepam that you were at least able to sleep 3 to 4 hours.  Without use of clonazepam you report no sleep hardly at all.  So we will increase clonazepam to 1 mg tablet to take at night-prior to sleep.  I will see how you do with this.  If you do not sleep well with this then might try Ambien as you have not use this before on review.  I want you to call us in 2 weeks and asked to speak to Roland or Rod Holler.  They both speak Spanish and you could inform them if your able to sleep 6 to 8hours with higher dose clonazepam.  If so then we will ask you to come in for uncontrolled medication contract and to give a UDS.  That way I can give you refills on a monthly basis.

## 2018-08-05 NOTE — Progress Notes (Signed)
Subjective:    Patient ID: Wayne Miller, male    DOB: 07-Sep-1980, 38 y.o.   MRN: [id number]  HPI  Pt in for follow up.  Pt has insomnia and anxiety. I had written him clonazepoam 0.5 mg dose and he states can sleep 3-4 hours a night. But if does not take anything can't sleep at all. He states other meds as we discussed on past did not help. The clonopin has helped the most. He wants to try to increase dose.   Review of Systems  Constitutional: Negative for chills, fatigue and fever.  HENT: Negative for dental problem.   Respiratory: Negative for cough, chest tightness, shortness of breath and wheezing.   Cardiovascular: Negative for chest pain and palpitations.  Gastrointestinal: Negative for abdominal pain.  Hematological: Negative for adenopathy. Does not bruise/bleed easily.  Psychiatric/Behavioral: Positive for sleep disturbance. Negative for confusion and dysphoric mood. The patient is nervous/anxious.     Past Medical History:  Diagnosis Date  . Blood in stool   . GERD (gastroesophageal reflux disease)   . Herniated nucleus pulposis of lumbosacral region   . Multiple gastric ulcers      Social History   Socioeconomic History  . Marital status: Married    Spouse name: Not on file  . Number of children: Not on file  . Years of education: Not on file  . Highest education level: Not on file  Occupational History  . Not on file  Social Needs  . Financial resource strain: Not on file  . Food insecurity:    Worry: Not on file    Inability: Not on file  . Transportation needs:    Medical: Not on file    Non-medical: Not on file  Tobacco Use  . Smoking status: Former Smoker    Types: Cigarettes    Last attempt to quit: 08/29/2013    Years since quitting: 4.9  . Smokeless tobacco: Never Used  Substance and Sexual Activity  . Alcohol use: Not Currently    Comment: past -not now  . Drug use: No  . Sexual activity: Yes  Lifestyle  . Physical activity:    Days  per week: Not on file    Minutes per session: Not on file  . Stress: Not on file  Relationships  . Social connections:    Talks on phone: Not on file    Gets together: Not on file    Attends religious service: Not on file    Active member of club or organization: Not on file    Attends meetings of clubs or organizations: Not on file    Relationship status: Not on file  . Intimate partner violence:    Fear of current or ex partner: Not on file    Emotionally abused: Not on file    Physically abused: Not on file    Forced sexual activity: Not on file  Other Topics Concern  . Not on file  Social History Narrative   ** Merged History Encounter **        Past Surgical History:  Procedure Laterality Date  . KNEE ARTHROSCOPY Left    left knee scope "tear repair"  . LUMBAR LAMINECTOMY/DECOMPRESSION MICRODISCECTOMY Right 09/11/2015   Procedure: MICRO LUMBAR DECOMPRESSION OF RIGHT L4-5 AND L5-S1 AND RIGHT FORAMINOTOMY L5 (2 LEVELS);  Surgeon: Susa Day, MD;  Location: [facility];  Service: Orthopedics;  Laterality: Right;    Family History  Problem Relation Age of Onset  . Arthritis  Mother   . Diabetes Mother   . Hyperlipidemia Mother   . Hypertension Mother   . Arthritis Father   . Depression Father   . Diabetes Father   . Hypertension Father   . Kidney disease Father   . Arthritis Maternal Grandmother   . Hypertension Maternal Grandmother   . Alcohol abuse Maternal Grandfather   . Arthritis Paternal Grandmother   . Birth defects Brother     Allergies  Allergen Reactions  . Zolpidem Nausea And Vomiting  . Ibuprofen Other (See Comments)    G I upset    Current Outpatient Medications on File Prior to Visit  Medication Sig Dispense Refill  . clonazePAM (KLONOPIN) 0.5 MG tablet Take 1 tablet (0.5 mg total) by mouth at bedtime. 14 tablet 0  . traZODone (DESYREL) 50 MG tablet Take 0.5-1 tablets (25-50 mg total) by mouth at bedtime as needed for sleep. 30 tablet 3  . Vitamin  D, Ergocalciferol, (DRISDOL) 1.25 MG (50000 UT) CAPS capsule One capsule by mouth once a week for 12 weeks. Then take 2000IU/day 12 capsule 0   No current facility-administered medications on file prior to visit.     BP 126/89   Pulse (!) 54   Temp 97.7 F (36.5 C) (Oral)   Resp 16   Ht 5\' 6"  (1.676 m)   Wt 223 lb 9.6 oz (101.4 kg)   SpO2 100%   BMI 36.09 kg/m       Objective:   Physical Exam  General Mental Status- Alert. General Appearance- Not in acute distress.    Cardiovascular Auscultation:Rythm- Regular. Murmurs & Other Heart Sounds:Auscultation of the heart reveals- No Murmurs.  Abdomen Inspection:-Inspeection Normal. Palpation/Percussion:Note:No mass. Palpation and Percussion of the abdomen reveal- Non Tender, Non Distended + BS, no rebound or guarding.   Neurologic Cranial Nerve exam:- CN III-XII intact(No nystagmus), symmetric smile. Strength:- 5/5 equal and symmetric strength both upper and lower extremities.      Assessment & Plan:  You do have both anxiety which appears to be affecting your ability to sleep.  You have tried various other medications for insomnia and did not sleep well.  You report with low-dose clonazepam that you were at least able to sleep 3 to 4 hours.  Without use of clonazepam you report no sleep hardly at all.  So we will increase clonazepam to 1 mg tablet to take at night-prior to sleep.  I will see how you do with this.  If you do not sleep well with this then might try Ambien as you have not use this before on review.  I want you to call us in 2 weeks and asked to speak to [place] or Rod Holler.  They both speak Spanish and you could inform them if your able to sleep 6 to 8hours with higher dose clonazepam.  If so then we will ask you to come in for uncontrolled medication contract and to give a UDS.  That way I can give you refills on a monthly basis.  25 minutes spent with pt today. 50% of time spent counseling on use of clonazepam.  Option of ambien if increase dose of clonazepam. But also will get specialist opinion. Explained controlled medication rules in event rx benzo months basis.  Mackie Pai, PA-C

## 2018-08-10 ENCOUNTER — Ambulatory Visit: Payer: Self-pay | Admitting: Family Medicine

## 2018-08-11 ENCOUNTER — Ambulatory Visit (INDEPENDENT_AMBULATORY_CARE_PROVIDER_SITE_OTHER): Payer: BLUE CROSS/BLUE SHIELD | Admitting: Neurology

## 2018-08-11 ENCOUNTER — Encounter: Payer: Self-pay | Admitting: Neurology

## 2018-08-11 VITALS — BP 131/88 | HR 63 | Ht 66.0 in | Wt 226.0 lb

## 2018-08-11 DIAGNOSIS — F5103 Paradoxical insomnia: Secondary | ICD-10-CM | POA: Insufficient documentation

## 2018-08-11 DIAGNOSIS — F418 Other specified anxiety disorders: Secondary | ICD-10-CM | POA: Insufficient documentation

## 2018-08-11 DIAGNOSIS — R4589 Other symptoms and signs involving emotional state: Secondary | ICD-10-CM | POA: Insufficient documentation

## 2018-08-11 MED ORDER — SERTRALINE HCL 25 MG PO TABS: 25.0000 mg | ORAL_TABLET | Freq: Every day | ORAL | 5 refills | Status: DC

## 2018-08-11 MED ORDER — BUPROPION HCL ER (XL) 150 MG PO TB24: 150.0000 mg | ORAL_TABLET | Freq: Every day | ORAL | 5 refills | Status: DC

## 2018-08-11 NOTE — Addendum Note (Signed)
Addended by: Larey Seat on: 08/11/2018 09:44 AM   Modules accepted: Orders

## 2018-08-11 NOTE — Patient Instructions (Signed)
Bupropion extended-release tablets  What is this medicine? BUPROPION (byoo PROE pee on) is used to treat anxiety, chronic insomnia, depression. This medicine may be used for other purposes; ask your health care provider or pharmacist if you have questions. COMMON BRAND NAME(S): Aplenzin, Budeprion XL, Forfivo XL, Wellbutrin XL What should I tell my health care provider before I take this medicine? They need to know if you have any of these conditions: -an eating disorder, such as anorexia or bulimia -bipolar disorder or psychosis -diabetes or high blood sugar, treated with medication -glaucoma -head injury or brain tumor -heart disease, previous heart attack, or irregular heart beat -high blood pressure -kidney or liver disease -seizures (convulsions) -suicidal thoughts or a previous suicide attempt -Tourette's syndrome -weight loss -an unusual or allergic reaction to bupropion, other medicines, foods, dyes, or preservatives -breast-feeding -pregnant or trying to become pregnant How should I use this medicine? Take this medicine by mouth with a glass of water. Follow the directions on the prescription label. You can take it with or without food. If it upsets your stomach, take it with food. Do not crush, chew, or cut these tablets. This medicine is taken once daily at the same time each day. Do not take your medicine more often than directed. Do not stop taking this medicine suddenly except upon the advice of your doctor. Stopping this medicine too quickly may cause serious side effects or your condition may worsen. A special MedGuide will be given to you by the pharmacist with each prescription and refill. Be sure to read this information carefully each time. Talk to your pediatrician regarding the use of this medicine in children. Special care may be needed. Overdosage: If you think you have taken too much of this medicine contact a poison control center or emergency room at once. NOTE:  This medicine is only for you. Do not share this medicine with others. What if I miss a dose? If you miss a dose, skip the missed dose and take your next tablet at the regular time. Do not take double or extra doses. What may interact with this medicine? Do not take this medicine with any of the following medications: -linezolid -MAOIs like Azilect, Carbex, Eldepryl, Marplan, Nardil, and Parnate -methylene blue (injected into a vein) -other medicines that contain bupropion like Zyban This medicine may also interact with the following medications: -alcohol -certain medicines for anxiety or sleep -certain medicines for blood pressure like metoprolol, propranolol -certain medicines for depression or psychotic disturbances -certain medicines for HIV or AIDS like efavirenz, lopinavir, nelfinavir, ritonavir -certain medicines for irregular heart beat like propafenone, flecainide -certain medicines for Parkinson's disease like amantadine, levodopa -certain medicines for seizures like carbamazepine, phenytoin, phenobarbital -cimetidine -clopidogrel -cyclophosphamide -digoxin -furazolidone -isoniazid -nicotine -orphenadrine -procarbazine -steroid medicines like prednisone or cortisone -stimulant medicines for attention disorders, weight loss, or to stay awake -tamoxifen -theophylline -thiotepa -ticlopidine -tramadol -warfarin This list may not describe all possible interactions. Give your health care provider a list of all the medicines, herbs, non-prescription drugs, or dietary supplements you use. Also tell them if you smoke, drink alcohol, or use illegal drugs. Some items may interact with your medicine. What should I watch for while using this medicine? Tell your doctor if your symptoms do not get better or if they get worse. Visit your doctor or health care professional for regular checks on your progress. Because it may take several weeks to see the full effects of this medicine, it  is important to continue your  treatment as prescribed by your doctor. Patients and their families should watch out for new or worsening thoughts of suicide or depression. Also watch out for sudden changes in feelings such as feeling anxious, agitated, panicky, irritable, hostile, aggressive, impulsive, severely restless, overly excited and hyperactive, or not being able to sleep. If this happens, especially at the beginning of treatment or after a change in dose, call your health care professional. Avoid alcoholic drinks while taking this medicine. Drinking large amounts of alcoholic beverages, using sleeping or anxiety medicines, or quickly stopping the use of these agents while taking this medicine may increase your risk for a seizure. Do not drive or use heavy machinery until you know how this medicine affects you. This medicine can impair your ability to perform these tasks. Do not take this medicine close to bedtime. It may prevent you from sleeping. Your mouth may get dry. Chewing sugarless gum or sucking hard candy, and drinking plenty of water may help. Contact your doctor if the problem does not go away or is severe. The tablet shell for some brands of this medicine does not dissolve. This is normal. The tablet shell may appear whole in the stool. This is not a cause for concern. What side effects may I notice from receiving this medicine? Side effects that you should report to your doctor or health care professional as soon as possible: -allergic reactions like skin rash, itching or hives, swelling of the face, lips, or tongue -breathing problems -changes in vision -confusion -elevated mood, decreased need for sleep, racing thoughts, impulsive behavior -fast or irregular heartbeat -hallucinations, loss of contact with reality -increased blood pressure -redness, blistering, peeling or loosening of the skin, including inside the mouth -seizures -suicidal thoughts or other mood  changes -unusually weak or tired -vomiting Side effects that usually do not require medical attention (report to your doctor or health care professional if they continue or are bothersome): -constipation -headache -loss of appetite -nausea -tremors -weight loss This list may not describe all possible side effects. Call your doctor for medical advice about side effects. You may report side effects to FDA at 1-800-FDA-1088. Where should I keep my medicine? Keep out of the reach of children. Store at room temperature between 15 and 30 degrees C (59 and 86 degrees F). Throw away any unused medicine after the expiration date. NOTE: This sheet is a summary. It may not cover all possible information. If you have questions about this medicine, talk to your doctor, pharmacist, or health care provider.  2019 Elsevier/Gold Standard (2015-11-15 13:55:13)

## 2018-08-11 NOTE — Progress Notes (Signed)
SLEEP MEDICINE CLINIC   Provider:  Larey Seat, MD   Primary Care Physician:  Elise Benne   Referring Provider: Orma Flaming, MD   Chief Complaint  Patient presents with  . New Patient (Initial Visit)    pt with wife, rm 3. pt states that since back surgery in 2018 he has been unable to get into a deep sleep. intermittently will snore during the night, denies apnea events. never had a sleep study. averages about 5 hours a night.     HPI:  Wayne Miller is a 38 y.o. male patient, seen here on 10-09-2018  in a referral  from Dr. Rogers Blocker for primary and chronic insomnia.    Chief complaint according to patient : "I can't go to sleep and if I sleep I have so many night mares, I don't feel I slept at all".  Refill Mr. Garske underwent back surgery he had a different job, work up to 18 hours a week, had a higher level of income and responsibility.  He has struggled with feeling demoted since his surgery.     Sleep habits are as follows: dinner time is 6 PM, with the family.no alcohol.  No caffeine use after lunch, bed time is 10 PM. Bedroom is cool, quiet and dark. He tries any sleep position. He sleeps mostly on his right side. He sleeps with 3-4 pillows, for beck support. He had back surgery 2 years ago. That's when it all started. He took hydrocodone for 1 week. He used gabapentin and from that day on he" lost his sleep" , even that he d/c the medication after 10 days.  He feels he doesn't sleep at all , but his wife witnessed him sleeping , but it is avery light sleep. He reports vivid nightmarish dreams all the time. No nocturia.  He rises at 4 AM- latest 4.30 AM. He works 10 hour shifts.  He is physically working. He can't nap even if he wanted to.    Sleep medical history:  All insomnia began after back surgery 09-2015    Family sleep history:    No anxiety disorder, the patient quit smoking about 4 years ago, he knows that his father has a form of arthritis as  has his maternal grandmother, his father may have suffered from depression diabetes in his father and mother are diagnosed, hyperlipidemia and his mother hypertension in father maternal grandmother and mother, his father has suffered from kidney disease.  He has a brother with a congenital birth defect.   Social history: married, full time gainfully employed, unloading trucks. 3 children, 63, 57 and 23 years old, daughters.   Review of Systems: Out of a complete 14 system review, the patient complains of only the following symptoms, and all other reviewed systems are negative.  Wife noted mild snoring some nights, non nocturia, some dry mouth, some morning headaches. Epworth score: 1 , Fatigue severity score : 9 , depression score PHQ 12, 6 points.    Social History   Socioeconomic History  . Marital status: Married    Spouse name: Not on file  . Number of children: Not on file  . Years of education: Not on file  . Highest education level: Not on file  Occupational History  . Not on file  Social Needs  . Financial resource strain: Not on file  . Food insecurity:    Worry: Not on file    Inability: Not on file  . Transportation needs:  Medical: Not on file    Non-medical: Not on file  Tobacco Use  . Smoking status: Former Smoker    Types: Cigarettes    Last attempt to quit: 08/29/2005    Years since quitting: 12.9  . Smokeless tobacco: Never Used  Substance and Sexual Activity  . Alcohol use: Not Currently    Comment: past -not now  . Drug use: No  . Sexual activity: Yes  Lifestyle  . Physical activity:    Days per week: Not on file    Minutes per session: Not on file  . Stress: Not on file  Relationships  . Social connections:    Talks on phone: Not on file    Gets together: Not on file    Attends religious service: Not on file    Active member of club or organization: Not on file    Attends meetings of clubs or organizations: Not on file    Relationship status:  Not on file  . Intimate partner violence:    Fear of current or ex partner: Not on file    Emotionally abused: Not on file    Physically abused: Not on file    Forced sexual activity: Not on file  Other Topics Concern  . Not on file  Social History Narrative   ** Merged History Encounter **        Family History  Problem Relation Age of Onset  . Arthritis Mother   . Diabetes Mother   . Hyperlipidemia Mother   . Hypertension Mother   . Arthritis Father   . Depression Father   . Diabetes Father   . Hypertension Father   . Kidney disease Father   . Arthritis Maternal Grandmother   . Hypertension Maternal Grandmother   . Alcohol abuse Maternal Grandfather   . Prostate cancer Maternal Grandfather   . Arthritis Paternal Grandmother   . Birth defects Brother     Past Medical History:  Diagnosis Date  . Blood in stool   . GERD (gastroesophageal reflux disease)   . Herniated nucleus pulposis of lumbosacral region   . Multiple gastric ulcers     Past Surgical History:  Procedure Laterality Date  . KNEE ARTHROSCOPY Left    left knee scope "tear repair"  . LUMBAR LAMINECTOMY/DECOMPRESSION MICRODISCECTOMY Right 09/11/2015   Procedure: MICRO LUMBAR DECOMPRESSION OF RIGHT L4-5 AND L5-S1 AND RIGHT FORAMINOTOMY L5 (2 LEVELS);  Surgeon: Susa Day, MD;  Location: [facility];  Service: Orthopedics;  Laterality: Right;    Current Outpatient Medications  Medication Sig Dispense Refill  . clonazePAM (KLONOPIN) 1 MG tablet Take 1 tablet (1 mg total) by mouth 2 (two) times daily as needed for anxiety. 14 tablet 0   No current facility-administered medications for this visit.     Allergies as of 08/11/2018 - Review Complete 08/11/2018  Allergen Reaction Noted  . Ibuprofen Other (See Comments) 09/11/2015    Vitals: BP 131/88   Pulse 63   Ht 5\' 6"  (1.676 m)   Wt 226 lb (102.5 kg)   BMI 36.48 kg/m  Last Weight:  Wt Readings from Last 1 Encounters:  08/11/18 226 lb (102.5 kg)    DJM:EQAS mass index is 36.48 kg/m.     Last Height:   Ht Readings from Last 1 Encounters:  08/11/18 5\' 6"  (1.676 m)    Physical exam:  General: The patient is awake, alert and appears not in acute distress. The patient is well groomed. Head: Normocephalic, atraumatic. Neck is  supple. Mallampati 3,  neck circumference:17. 5 . Nasal airflow patent - strong midfacial bone structure.  Hinton Dyer is seen.  Cardiovascular: Bradycardia at rest-  Regular rate and rhythm , without murmurs or carotid bruit, and without distended neck veins. Respiratory: Lungs are clear to auscultation. Skin:  Without evidence of edema, or rash Trunk: BMI is 36.4 . The patient's posture is erect   Neurologic exam : The patient is awake and alert, oriented to place and time.  He reports racing thoughts, concentration difficulties.    Attention span & concentration ability appears normal.  Speech is fluent,  without dysarthria, dysphonia or aphasia.  Mood and affect are appropriate.  Cranial nerves: Pupils are equal and briskly reactive to light. Extraocular movements  in vertical and horizontal planes intact and without nystagmus. Visual fields by finger perimetry are intact. Hearing to finger rub intact.   Facial sensation intact to fine touch.  Facial motor strength is symmetric and tongue and uvula move midline. Shoulder shrug was symmetrical.   Motor exam:  Normal tone, muscle bulk and symmetric strength in all extremities. Sensory:  Fine touch, pinprick and vibration were tested in all extremities. Proprioception tested in the upper extremities was normal.  Coordination: Rapid alternating movements in the fingers/hands was normal. Finger-to-nose maneuver  normal without evidence of ataxia, dysmetria or tremor.  Gait and station: Patient walks without assistive device and is able unassisted to climb up to the exam table. Strength within normal limits.  Stance is stable and normal.   Tandem gait is  unfragmented. Turns with 3 Steps.  Romberg testing is  negative.  Deep tendon reflexes: in the  upper and lower extremities are symmetric and intact. The patella reflex is very brisk.  Babinski maneuver response is  downgoing.    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  The patient and his wife do not remember any comments about difficulties with intubation or extubation, hypoxemia, etc. He is a mild snorer. Has good sleep hygiene.   2) medication reviewed:  Klonopin only- 1 mg which helped "not much"   3) sleep study ordered to see if sleep perception is off- wife sees him asleep, he swears he is not. Also looking at REM sleep, and snoring,    The patient was advised of the nature of the diagnosed disorder , the treatment options and the  risks for general health and wellness arising from not treating the condition.   I spent more than 45 minutes of face to face time with the patient, given that we used translation service. .  Greater than 50% of time was spent in counseling and coordination of care. We have discussed the diagnosis and differential and I answered the patient's questions.    Plan:  Treatment plan and additional workup : attended sleep study ,parasomnia montage.  I like to start wellbutrin in AM and zoloft in Pm.  Rv after sleep study.   Larey Seat, MD 09/09/345, 4:25 AM  Certified in Neurology by ABPN Certified in [place] by [facility] [address] [place], [place] [address]

## 2018-09-15 ENCOUNTER — Encounter: Payer: Self-pay | Admitting: Medical

## 2018-09-15 ENCOUNTER — Telehealth: Payer: Self-pay | Admitting: Medical

## 2018-09-15 NOTE — Telephone Encounter (Signed)
Pt's wife left a vm stating pt's employer informed him that one of the other employees are having sxs and they request all the other employees to be checked out. She states pt is not currently having sxs but he is not able to return to work without a note stating he is cleared. Per wife he does not speak english and an interpretor is needed if you wanted to speak to him directly.

## 2018-09-19 ENCOUNTER — Other Ambulatory Visit: Payer: Self-pay

## 2018-09-19 ENCOUNTER — Telehealth: Payer: Self-pay | Admitting: Medical

## 2018-09-19 ENCOUNTER — Telehealth: Payer: BLUE CROSS/BLUE SHIELD | Admitting: Physician Assistant

## 2018-09-19 ENCOUNTER — Encounter: Payer: Self-pay | Admitting: Medical

## 2018-09-19 ENCOUNTER — Ambulatory Visit (INDEPENDENT_AMBULATORY_CARE_PROVIDER_SITE_OTHER): Payer: BLUE CROSS/BLUE SHIELD | Admitting: Medical

## 2018-09-19 DIAGNOSIS — K219 Gastro-esophageal reflux disease without esophagitis: Secondary | ICD-10-CM

## 2018-09-19 DIAGNOSIS — Z20828 Contact with and (suspected) exposure to other viral communicable diseases: Secondary | ICD-10-CM | POA: Diagnosis not present

## 2018-09-19 DIAGNOSIS — Z20822 Contact with and (suspected) exposure to covid-19: Secondary | ICD-10-CM

## 2018-09-19 MED ORDER — OMEPRAZOLE 20 MG PO CPDR: 20.0000 mg | DELAYED_RELEASE_CAPSULE | Freq: Every day | ORAL | 0 refills | Status: DC

## 2018-09-19 NOTE — Telephone Encounter (Signed)
MyChart message from Pt regarding this sent to PCP.

## 2018-09-19 NOTE — Progress Notes (Signed)
Subjective:    Patient ID: Wayne Miller, male    DOB: 01/20/1981, 38 y.o.   MRN: [id number]  HPI  Virtual Visit via Video Note  I connected with Wayne Miller on 09/19/18 at  2:20 PM EDT by a video enabled telemedicine application and verified that I am speaking with the correct person using two identifiers.   I discussed the limitations of evaluation and management by telemedicine and the availability of in person appointments. The patient expressed understanding and agreed to proceed.  History of Present Illness:  Pt states his work told him to get evaluated since one of his coworkers got sick. Pt state he was not given any specific information regarding illness of the person but it sounds like this work may have covid of some variant of potential symptoms. But patient not given any specific details.  Pt works in [employer], forensic. Person who is sick does not have any contact with receiving dept.   Pt has not had any travel out of state. No fever, no chills, no nasal congestion, no wheezing. No sob. No muscle aches.      Observations/Objective: No acute distress.  Assessment and Plan: Patient reports situation at work were 1 of the many coworkers at [employer] is  sick.  The nature of coworkers illnesses not known at this time.  Patient states that his employer/supervisor told him to come get a note to return to work.  Patient has absolutely no symptoms whatsoever presently.  He has not traveled nor does he have any known exposure to person that tested positive covid.   I explained in detail to the patient that the nature of patient's coworkers illness is important and he should inquire as to whether or not this particular coworker has covid virus test pending.  Patient reassures me that he does not have any close direct contact to this particular employee as a work and totally different departments.  I did write the return to work note for tomorrow explaining that patient  has had no symptoms whatsoever.  I did explain on the note that if his symptoms change or if the coworker did end up testing positive then his return to work note would be subject to change.  I explained this to the patient and he expressed understanding.  Also asked him not to take any Tylenol or ibuprofen.  Asked him to check his temperature daily and notify me if any temperature over 100.  Also notify me if any new or changing signs and symptoms.  Patient expressed understanding.  Follow-up as needed.  Also did explain to patient that if he did find that he has close exposure to someone with positive test then he would need to do 2 week home quarantine and be off of work.  Follow Up Instructions:    I discussed the assessment and treatment plan with the patient. The patient was provided an opportunity to ask questions and all were answered. The patient agreed with the plan and demonstrated an understanding of the instructions.   The patient was advised to call back or seek an in-person evaluation if the symptoms worsen or if the condition fails to improve as anticipated.  I provided 25 minutes of non-face-to-face time during this encounter.   Mackie Pai, PA-C   Review of Systems  Constitutional: Negative for appetite change, diaphoresis and fatigue.  HENT: Negative for congestion, ear pain, facial swelling, postnasal drip, rhinorrhea, sneezing and sore throat.   Respiratory: Negative  for cough, chest tightness, shortness of breath, wheezing and stridor.   Cardiovascular: Negative for chest pain and palpitations.  Gastrointestinal: Negative for abdominal pain, blood in stool, constipation and diarrhea.  Musculoskeletal: Negative for back pain and myalgias.  Skin: Negative for rash.  Neurological: Negative for dizziness, weakness, light-headedness and headaches.  Hematological: Negative for adenopathy. Does not bruise/bleed easily.  Psychiatric/Behavioral: Negative for behavioral  problems and confusion.       Objective:   Physical Exam  NA      Assessment & Plan:

## 2018-09-19 NOTE — Patient Instructions (Addendum)
Patient reports situation at work were 1 of the many coworkers at [employer] is sick.  The nature of coworkers illnesses not known at this time.  Patient states that his employer/supervisor told him to come get a note to return to work.  Patient has absolutely no symptoms or signs whatsoever presently.  He has not traveled nor does he have any known exposure to person that tested positive covid.   I explained in detail to the patient that the nature of patient's coworkers illness is important and he should inquire as to whether or not this particular coworker has covid virus test pending.  Patient reassures me that he does not have any close direct contact to this particular employee as a work and totally different departments.  I did write the return to work note for tomorrow explaining that patient has had no symptoms whatsoever.  I did explain on the note that if his symptoms change or if the coworker did end up testing positive then his return to work note would be subject to change.  I explained this to the patient and he expressed understanding.  Also asked him not to take any Tylenol or ibuprofen.  Asked him to check his temperature daily and notify me if any temperature over 100.  Also notify me if any new or changing signs and symptoms.  Patient expressed understanding.  Follow-up as needed.  Also did explain to patient that if he did find that he has close exposure to someone with positive test then he would need to do 2 week home quarantine and be off of work.

## 2018-09-19 NOTE — Progress Notes (Signed)
We are sorry that you are not feeling well.  Here is how we plan to help!  Based on what you shared with me it looks like you most likely have Gastroesophageal Reflux Disease (GERD)  Gastroesophageal reflux disease (GERD) happens when acid from your stomach flows up into the esophagus.  When acid comes in contact with the esophagus, the acid causes sorenss (inflammation) in the esophagus.  Over time, GERD may create small holes (ulcers) in the lining of the esophagus.  I have prescribed Omeprazole, a Protein Pump inhibitor, 20 mg daily until you follow up with a provider. Please make an appointment in the next few weeks to follow-up regarding these symptoms.   Your symptoms should improve in the next day or two.  You can use antacids as needed until symptoms resolve.  Call us if your heartburn worsens, you have trouble swallowing, weight loss, spitting up blood or recurrent vomiting.  Home Care:  May include lifestyle changes such as weight loss, quitting smoking and alcohol consumption  Avoid foods and drinks that make your symptoms worse, such as:  Caffeine or alcoholic drinks  Chocolate  Peppermint or mint flavorings  Garlic and onions  Spicy foods  Citrus fruits, such as oranges, lemons, or limes  Tomato-based foods such as sauce, chili, salsa and pizza  Fried and fatty foods  Avoid lying down for 3 hours prior to your bedtime or prior to taking a nap  Eat small, frequent meals instead of a large meals  Wear loose-fitting clothing.  Do not wear anything tight around your waist that causes pressure on your stomach.  Raise the head of your bed 6 to 8 inches with wood blocks to help you sleep.  Extra pillows will not help.  Seek Help Right Away If:  You have pain in your arms, neck, jaw, teeth or back  Your pain increases or changes in intensity or duration  You develop nausea, vomiting or sweating (diaphoresis)  You develop shortness of breath or you faint  Your  vomit is green, yellow, black or looks like coffee grounds or blood  Your stool is red, bloody or black  These symptoms could be signs of other problems, such as heart disease, gastric bleeding or esophageal bleeding.  Make sure you :  Understand these instructions.  Will watch your condition.  Will get help right away if you are not doing well or get worse.  Your e-visit answers were reviewed by a board certified advanced clinical practitioner to complete your personal care plan.  Depending on the condition, your plan could have included both over the counter or prescription medications.  If there is a problem please reply  once you have received a response from your provider.  Your safety is important to [place].  If you have drug allergies check your prescription carefully.    You can use MyChart to ask questions about today's visit, request a non-urgent call back, or ask for a work or school excuse for 24 hours related to this e-Visit. If it has been greater than 24 hours you will need to follow up with your provider, or enter a new e-Visit to address those concerns.  You will get an e-mail in the next two days asking about your experience.  I hope that your e-visit has been valuable and will speed your recovery. Thank you for using e-visits.

## 2018-09-19 NOTE — Telephone Encounter (Signed)
Pt needs to have virtual visit to discuss coworker that was sick. Maybe covid. He may need note? virtul visit please.

## 2018-09-20 NOTE — Telephone Encounter (Signed)
Pt was already seen yesterday VOV. Done

## 2018-10-20 ENCOUNTER — Other Ambulatory Visit: Payer: Self-pay

## 2018-10-20 ENCOUNTER — Encounter (HOSPITAL_COMMUNITY): Payer: Self-pay | Admitting: Emergency Medicine

## 2018-10-20 ENCOUNTER — Emergency Department (HOSPITAL_COMMUNITY)
Admission: EM | Admit: 2018-10-20 | Discharge: 2018-10-20 | Disposition: A | Discharge status: Home / Self Care | Payer: BLUE CROSS/BLUE SHIELD | Attending: Emergency Medicine | Admitting: Emergency Medicine

## 2018-10-20 DIAGNOSIS — Z139 Encounter for screening, unspecified: Secondary | ICD-10-CM | POA: Insufficient documentation

## 2018-10-20 DIAGNOSIS — Z87891 Personal history of nicotine dependence: Secondary | ICD-10-CM | POA: Insufficient documentation

## 2018-10-20 DIAGNOSIS — R110 Nausea: Secondary | ICD-10-CM | POA: Diagnosis not present

## 2018-10-20 DIAGNOSIS — Z03818 Encounter for observation for suspected exposure to other biological agents ruled out: Secondary | ICD-10-CM | POA: Diagnosis not present

## 2018-10-20 NOTE — ED Notes (Signed)
Patient verbalizes understanding of discharge instructions. Opportunity for questioning and answers were provided. Armband removed by staff, pt discharged from ED.  

## 2018-10-20 NOTE — ED Provider Notes (Signed)
[facility] EMERGENCY DEPARTMENT Provider Note   CSN: [id number] Arrival date & time: 10/20/18  1226    History   Chief Complaint Chief Complaint  Patient presents with  . Nausea    HPI Wayne Miller is a 38 y.o. male.     HPI   38 year old male presents today for coronavirus testing.  Patient notes yesterday he felt slightly nauseous, none today.  Patient denies any fever, cough, runny nose, vomiting, or any other complaints presently.  Patient notes that he works for a company where somebody did test positive but he does not know this person and does not interact with them.  Patient denies any smoking history, denies any significant comorbidities.  She notes he has a primary care provider but has not reached out to them.  Past Medical History:  Diagnosis Date  . Blood in stool   . GERD (gastroesophageal reflux disease)   . Herniated nucleus pulposis of lumbosacral region   . Multiple gastric ulcers     Patient Active Problem List   Diagnosis Date Noted  . Anxiety about health 08/11/2018  . 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 Vitamin D deficiency 05/13/2018  . Spinal stenosis of lumbar region 09/11/2015    Past Surgical History:  Procedure Laterality Date  . KNEE ARTHROSCOPY Left    left knee scope "tear repair"  . LUMBAR LAMINECTOMY/DECOMPRESSION MICRODISCECTOMY Right 09/11/2015   Procedure: MICRO LUMBAR DECOMPRESSION OF RIGHT L4-5 AND L5-S1 AND RIGHT FORAMINOTOMY L5 (2 LEVELS);  Surgeon: Susa Day, MD;  Location: [facility] ORS;  Service: Orthopedics;  Laterality: Right;        Home Medications    Prior to Admission medications   Medication Sig Start Date End Date Taking? Authorizing Provider  buPROPion (WELLBUTRIN XL) 150 MG 24 hr tablet Take 1 tablet (150 mg total) by mouth daily. 08/11/18   Dohmeier, Asencion Partridge, MD  clonazePAM (KLONOPIN) 1 MG tablet Take 1 tablet (1 mg total) by mouth 2 (two) times daily as needed for anxiety. 08/05/18   Saguier,  Percell Miller, PA-C  omeprazole (PRILOSEC) 20 MG capsule Take 1 capsule (20 mg total) by mouth daily. 09/19/18   McVey, Gelene Mink, PA-C  sertraline (ZOLOFT) 25 MG tablet Take 1 tablet (25 mg total) by mouth at bedtime. 08/11/18   Dohmeier, Asencion Partridge, MD    Family History Family History  Problem Relation Age of Onset  . Arthritis Mother   . Diabetes Mother   . Hyperlipidemia Mother   . Hypertension Mother   . Arthritis Father   . Depression Father   . Diabetes Father   . Hypertension Father   . Kidney disease Father   . Arthritis Maternal Grandmother   . Hypertension Maternal Grandmother   . Alcohol abuse Maternal Grandfather   . Prostate cancer Maternal Grandfather   . Arthritis Paternal Grandmother   . Birth defects Brother     Social History Social History   Tobacco Use  . Smoking status: Former Smoker    Types: Cigarettes    Last attempt to quit: 08/29/2005    Years since quitting: 13.1  . Smokeless tobacco: Never Used  Substance Use Topics  . Alcohol use: Not Currently    Comment: past -not now  . Drug use: No     Allergies   Ibuprofen   Review of Systems Review of Systems  All other systems reviewed and are negative.    Physical Exam Updated Vital Signs BP 140/90 (BP Location: Right Arm)   Pulse  67   Temp 98.1 F (36.7 C) (Oral)   Resp 16   SpO2 100%   Physical Exam Vitals signs and nursing note reviewed.  Constitutional:      Appearance: He is well-developed.  HENT:     Head: Normocephalic and atraumatic.  Eyes:     General: No scleral icterus.       Right eye: No discharge.        Left eye: No discharge.     Conjunctiva/sclera: Conjunctivae normal.     Pupils: Pupils are equal, round, and reactive to light.  Neck:     Musculoskeletal: Normal range of motion.     Vascular: No JVD.     Trachea: No tracheal deviation.  Pulmonary:     Effort: Pulmonary effort is normal.     Breath sounds: No stridor.  Neurological:     Mental Status: He is  alert and oriented to person, place, and time.     Coordination: Coordination normal.  Psychiatric:        Behavior: Behavior normal.        Thought Content: Thought content normal.        Judgment: Judgment normal.      ED Treatments / Results  Labs (all labs ordered are listed, but only abnormal results are displayed) Labs Reviewed - No data to display  EKG None  Radiology No results found.  Procedures Procedures (including critical care time)  Medications Ordered in ED Medications - No data to display   Initial Impression / Assessment and Plan / ED Course  I have reviewed the triage vital signs and the nursing notes.  Pertinent labs & imaging results that were available during my care of the patient were reviewed by me and considered in my medical decision making (see chart for details).        38 year old male presents today for COVID testing.  He is asymptomatic he has no findings today that would indicate he does have coronavirus.  He is otherwise healthy with no significant risk factors, he does not qualify for testing at this point in the emergency room.  I discussed the necessity of outpatient discussion with his primary care for ongoing surveillance.  Patient is given return precautions.  Verbalized understanding and agreement to today's plan.  Final Clinical Impressions(s) / ED Diagnoses   Final diagnoses:  Encounter for screening    ED Discharge Orders    None       Francee Gentile 10/20/18 1349    Davonna Belling, MD 10/20/18 [phone]

## 2018-10-20 NOTE — ED Triage Notes (Signed)
Pt reports nausea x 1 day, reports he wants testing for COVD due to positive contact at work. Denies vomiting, abd pain or fevers.

## 2018-10-20 NOTE — Discharge Instructions (Addendum)
As we discussed today you have no symptoms consistent with coronavirus.  Routine screening in the emergency room is not indicated at this time, please contact your primary care provider for ongoing evaluation.

## 2018-10-21 ENCOUNTER — Telehealth: Payer: Self-pay | Admitting: Medical

## 2018-10-21 NOTE — Telephone Encounter (Signed)
Pt seen in ED for nausea. Will you call him and offer follow up virtual visit with me. Epic states spanish speaker.

## 2018-10-21 NOTE — Telephone Encounter (Signed)
LVM for pt to call and schedule VOV for ED fu appt.

## 2018-11-01 DIAGNOSIS — Z20828 Contact with and (suspected) exposure to other viral communicable diseases: Secondary | ICD-10-CM | POA: Diagnosis not present

## 2018-11-06 ENCOUNTER — Ambulatory Visit (INDEPENDENT_AMBULATORY_CARE_PROVIDER_SITE_OTHER): Payer: BLUE CROSS/BLUE SHIELD | Admitting: Neurology

## 2018-11-06 DIAGNOSIS — F418 Other specified anxiety disorders: Secondary | ICD-10-CM

## 2018-11-06 DIAGNOSIS — F5103 Paradoxical insomnia: Secondary | ICD-10-CM

## 2018-11-06 DIAGNOSIS — G478 Other sleep disorders: Secondary | ICD-10-CM | POA: Diagnosis not present

## 2018-11-14 NOTE — Procedures (Signed)
PATIENT'S NAME:  Wayne Miller, Wayne Miller DOB:      [birth year]      MR#:    [id number]     DATE OF RECORDING: 11/06/2018 by Wayne Miller REFERRING M.D.:  Orma Flaming, MD- Arletta Bale, MD  Study Performed:   Baseline Polysomnogram HISTORY:  Wayne Miller is a 38 y.o. male patient and seen here on 10-09-2018 in a referral from Dr. Rogers Blocker for primary and chronic insomnia.  Chief complaint according to patient: "I can't go to sleep and if I sleep I have so many night mares, I don't feel I slept at all".   Wayne Miller underwent back surgery, he had a different job at the time, worked up to 18 hours a week, had a higher level of income and responsibility.  He has struggled with feeling demoted since his surgery and being unable to return to the previous level of employment. Bedroom is cool, quiet and dark. He tries any sleep position. He sleeps mostly on his right side. He sleeps with 3-4 pillows, for beck support. He had back surgery 2 years ago. That's when it all started. He took hydrocodone for 1 week. He used gabapentin and from that day on he" lost his sleep" reportedly- even that he d/c the medication after 10 days.  He feels he doesn't sleep at all, but his wife witnessed him sleeping, but it is a very light sleep. He reports vivid nightmarish dreams all the time. He rises at 4 AM- latest 4.30 AM. He works 10 hour shifts.  He is physically working. He can't nap in daytime even if he wanted to.    Sleep medical history:  All insomnia reportedly began after back surgery 09-2015     The patient endorsed the Epworth Sleepiness Scale at 1/24 points.   The patient's weight 226 pounds with a height of 66 (inches), resulting in a BMI of 36.5 kg/m2. The patient's neck circumference measured 17.5 inches.  CURRENT MEDICATIONS: Klonopin   PROCEDURE:  This is a multichannel digital polysomnogram utilizing the Somnostar 11.2 system.  Electrodes and sensors were applied and monitored per AASM Specifications.   EEG, EOG,  Chin and Limb EMG, were sampled at 200 Hz.  ECG, Snore and Nasal Pressure, Thermal Airflow, Respiratory Effort, CPAP Flow and Pressure, Oximetry was sampled at 50 Hz. Digital video and audio were recorded.      BASELINE STUDY: Lights Out was at 21:30 and Lights On at 04:57.  Total recording time (TRT) was 448 minutes, with a total sleep time (TST) of 396.5 minutes.   The patient's sleep latency was 12.5 minutes.  REM latency was 62.5 minutes.  The sleep efficiency was 88.5 %.     SLEEP ARCHITECTURE: WASO (Wake after sleep onset) was 38.5 minutes.  There were 3.5 minutes in Stage N1, 286.5 minutes Stage N2, 26.5 minutes Stage N3 and 80 minutes in Stage REM.  The percentage of Stage N1 was .9%, Stage N2 was 72.3%, Stage N3 was 6.7% and Stage R (REM sleep) was 20.2%.  The arousals were noted as: 20 were spontaneous, 1 associated with PLMs, and another one associated with respiratory events.     RESPIRATORY ANALYSIS:  There were a total of 4 respiratory events:  3 obstructive apneas, 0 central apneas and 0 mixed apneas with a total of 3 apneas and an apnea index (AI) of 0.5 /hour. There was 1 hypopneas with a hypopnea index of 0.2 /hour. The total APNEA/HYPOPNEA INDEX (AHI) was 0.6 /hour.  2  events occurred in REM sleep and 0 events in NREM.  The REM AHI was 1.5 /hour, versus a non-REM AHI of 0.4. The patient spent 58 minutes of total sleep time in the supine position and 339 minutes in non-supine. The supine AHI was 1.0 versus a non-supine AHI of 0.6.  OXYGEN SATURATION & C02:  The Wake baseline 02 saturation was 96%, with the lowest being 91%. Time spent below 89% saturation equaled 0 minutes. The arousals were noted as: 20 were spontaneous, 1 was associated with PLMs, and another one associated with respiratory events. The patient had a total of 8 Periodic Limb Movements.  The Periodic Limb Movement (PLM) index was 1.2 and the PLM Arousal index was 0.2/hour. Audio and video analysis did not show any  abnormal or unusual movements, behaviors, phonations or vocalizations.   The patient took bathroom breaks. Snoring was noted. EKG was in keeping with normal sinus rhythm (NSR). Post-study, the patient indicated that sleep was the same as usual.   IMPRESSION: No evidence of Insomnia- the patient reached a sleep efficiency of 89%- normal- and a total sleep time of 397 minutes.  1. No Obstructive Sleep Apnea (OSA). 2. No Periodic Limb Movement Disorder (PLMD). 3. Rather mild to moderate audible Snoring 4. normal EKG  RECOMMENDATIONS:  1. I cannot identify an organic sleep disorder, rather a sleep perception disorder- consider referral to cognitive behavior therapy.   I certify that I have reviewed the entire raw data recording prior to the issuance of this report in accordance with the Standards of Accreditation of the American Academy of Sleep Medicine (AASM)   Larey Seat, MD Diplomat, American Board of Neurology ( ABPN)  Diplomat, American Board of Sleep Medicine Market researcher, [place] [facility]  11-14-2018

## 2018-11-15 ENCOUNTER — Telehealth: Payer: Self-pay | Admitting: Neurology

## 2018-11-15 ENCOUNTER — Encounter: Payer: Self-pay | Admitting: Neurology

## 2018-11-15 NOTE — Telephone Encounter (Signed)
Called the pt to review SSR, no answer. LVM for the pt to call back. Advised that I would also send a mychart message.

## 2019-01-24 NOTE — Telephone Encounter (Signed)
LVM at [phone] for pt to call back and schedule fu appt with provider.

## 2019-01-24 NOTE — Telephone Encounter (Signed)
Pt scheduled on 01-27-2019 to see provider. Done

## 2019-01-27 ENCOUNTER — Encounter: Payer: Self-pay | Admitting: Medical

## 2019-01-27 ENCOUNTER — Ambulatory Visit (HOSPITAL_BASED_OUTPATIENT_CLINIC_OR_DEPARTMENT_OTHER)
Admission: RE | Admit: 2019-01-27 | Discharge: 2019-01-27 | Disposition: A | Discharge status: Home / Self Care | Payer: BC Managed Care – PPO | Source: Ambulatory Visit | Attending: Medical | Admitting: Medical

## 2019-01-27 ENCOUNTER — Other Ambulatory Visit: Payer: Self-pay

## 2019-01-27 ENCOUNTER — Ambulatory Visit (INDEPENDENT_AMBULATORY_CARE_PROVIDER_SITE_OTHER): Payer: BC Managed Care – PPO | Admitting: Medical

## 2019-01-27 VITALS — BP 127/83 | HR 67 | Temp 97.9°F | Resp 16 | Ht 66.0 in | Wt 224.0 lb

## 2019-01-27 DIAGNOSIS — M545 Low back pain, unspecified: Secondary | ICD-10-CM

## 2019-01-27 MED ORDER — KETOROLAC TROMETHAMINE 60 MG/2ML IM SOLN
60.0000 mg | Freq: Once | INTRAMUSCULAR | Status: AC
  Administered 2019-01-27: 09:00:00 60 mg via INTRAMUSCULAR

## 2019-01-27 MED ORDER — SERTRALINE HCL 25 MG PO TABS: 25.0000 mg | ORAL_TABLET | Freq: Every day | ORAL | 0 refills | Status: DC

## 2019-01-27 MED ORDER — CYCLOBENZAPRINE HCL 5 MG PO TABS: 5.0000 mg | ORAL_TABLET | Freq: Every day | ORAL | 0 refills | Status: DC

## 2019-01-27 NOTE — Progress Notes (Signed)
Subjective:    Patient ID: Wayne Miller, male    DOB: [birth year], 38 y.o.   MRN: [id number]  HPI   Pt in for back pain. He states feels like baseball in lower back. Pain when he moves. Pt states back pain for weeks. No injury or fall. Pt states he can take ibuprofen due to heart burn.   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 Tylenol does not help much.  Pt wants to see Dr. Lynann Bologna. [facility].   Pt also mentions insomnia. But sleep study showed efficiency of 88%.  Neurologist thought he had anxiety about his healthy   Pt admits he feels anxious a lot. Every day he feels anxious. No panic attacks. Not reporting depression.   Review of Systems  Constitutional: Negative for chills, fatigue and fever.  Respiratory: Negative for chest tightness, shortness of breath and wheezing.   Cardiovascular: Negative for chest pain and palpitations.  Gastrointestinal: Negative for abdominal pain.  Musculoskeletal: Negative for back pain, myalgias and neck stiffness.  Skin: Negative for rash.  Neurological: Negative for syncope, facial asymmetry, weakness and light-headedness.  Hematological: Negative for adenopathy. Does not bruise/bleed easily.  Psychiatric/Behavioral: Negative for behavioral problems and decreased concentration.    Past Medical History:  Diagnosis Date  . Blood in stool   . GERD (gastroesophageal reflux disease)   . Herniated nucleus pulposis of lumbosacral region   . Multiple gastric ulcers      Social History   Socioeconomic History  . Marital status: Married    Spouse name: Not on file  . Number of children: Not on file  . Years of education: Not on file  . Highest education level: Not on file  Occupational History  . Not on file  Social Needs  . Financial resource strain: Not on file  . Food insecurity    Worry: Not on file    Inability: Not on file  . Transportation needs    Medical: Not on file    Non-medical:  Not on file  Tobacco Use  . Smoking status: Former Smoker    Types: Cigarettes    Quit date: 08/29/2005    Years since quitting: 13.4  . Smokeless tobacco: Never Used  Substance and Sexual Activity  . Alcohol use: Not Currently    Comment: past -not now  . Drug use: No  . Sexual activity: Yes  Lifestyle  . Physical activity    Days per week: Not on file    Minutes per session: Not on file  . Stress: Not on file  Relationships  . Social Herbalist on phone: Not on file    Gets together: Not on file    Attends religious service: Not on file    Active member of club or organization: Not on file    Attends meetings of clubs or organizations: Not on file    Relationship status: Not on file  . Intimate partner violence    Fear of current or ex partner: Not on file    Emotionally abused: Not on file    Physically abused: Not on file    Forced sexual activity: Not on file  Other Topics Concern  . Not on file  Social History Narrative   ** Merged History Encounter **        Past Surgical History:  Procedure Laterality Date  . KNEE ARTHROSCOPY Left    left knee scope "tear repair"  .  LUMBAR LAMINECTOMY/DECOMPRESSION MICRODISCECTOMY Right 09/11/2015   Procedure: MICRO LUMBAR DECOMPRESSION OF RIGHT L4-5 AND L5-S1 AND RIGHT FORAMINOTOMY L5 (2 LEVELS);  Surgeon: Susa Day, MD;  Location: [facility];  Service: Orthopedics;  Laterality: Right;    Family History  Problem Relation Age of Onset  . Arthritis Mother   . Diabetes Mother   . Hyperlipidemia Mother   . Hypertension Mother   . Arthritis Father   . Depression Father   . Diabetes Father   . Hypertension Father   . Kidney disease Father   . Arthritis Maternal Grandmother   . Hypertension Maternal Grandmother   . Alcohol abuse Maternal Grandfather   . Prostate cancer Maternal Grandfather   . Arthritis Paternal Grandmother   . Birth defects Brother     Allergies  Allergen Reactions  . Ibuprofen Other (See  Comments)    G I upset    Current Outpatient Medications on File Prior to Visit  Medication Sig Dispense Refill  . clonazePAM (KLONOPIN) 1 MG tablet Take 1 tablet (1 mg total) by mouth 2 (two) times daily as needed for anxiety. 14 tablet 0   No current facility-administered medications on file prior to visit.     BP 127/83   Pulse 67   Temp 97.9 F (36.6 C) (Temporal)   Resp 16   Ht 5\' 6"  (1.676 m)   Wt 224 lb (101.6 kg)   SpO2 100%   BMI 36.15 kg/m       Objective:   Physical Exam   General Appearance- Not in acute distress.    Chest and Lung Exam Auscultation: Breath sounds:-Normal. Clear even and unlabored. Adventitious sounds:- No Adventitious sounds.  Cardiovascular Auscultation:Rythm - Regular, rate and rythm. Heart Sounds -Normal heart sounds.  Abdomen Inspection:-Inspection Normal.  Palpation/Perucssion: Palpation and Percussion of the abdomen reveal- Non Tender, No Rebound tenderness, No rigidity(Guarding) and No Palpable abdominal masses.  Liver:-Normal.  Spleen:- Normal.   Back Mid lumbar spine tenderness to palpation. Pain on straight leg lift. Pain on lateral movements and flexion/extension of the spine.  Lower ext neurologic  L5-S1 sensation intact bilaterally. Normal patellar reflexes bilaterally. No foot drop bilaterally.     Assessment & Plan:   For low back pain that is moderate will get lumbar xray since pain for weeks and you also request referral to ortho. Referral placed.  For moderate pain today toradol 60 mg im. Can use tylenol later if needed and rx flexril 5 mg to use over next 5 nights. Back stretching exercises as tolerated. Work excuse offered until Monday.  For anxiety, rx sertraline.  Will see sertraline decrease anxiety and improves sleep.  Follow up 2 weeks or as needed  25 minutes spent with pt. 50% of time spent with pt couneling on dx and tx plan  Mackie Pai, PA-C

## 2019-01-27 NOTE — Patient Instructions (Addendum)
For low back pain that is moderate will get lumbar xray since pain for weeks and you also request referral to ortho. Referral placed.  For moderate pain today toradol 60 mg im. Can use tylenol later if needed and rx flexril 5 mg to use over next 5 nights. Back stretching exercises as tolerated. Work excuse offered until Monday.  For anxiety, rx sertraline.  Will see sertraline decrease anxiety and improves sleep.  Follow up 2 weeks or as needed   Ejercicios para la espalda Back Exercises Estos ejercicios ayudan a fortalecer el tronco y la espalda. adems, ayudan a mantener la flexibilidad de la zona lumbar. Hacer estos ejercicios puede ser de ayuda para evitar o Best boy de espalda.  Si tiene dolor de espalda, trate de Dispensing optician ejercicios 2 o 3veces por da, o como se lo haya indicado el mdico.  A medida que [place], haga los ejercicios una vez por da. Repita los ejercicios con ms frecuencia como se lo haya indicado el mdico.  Para evitar que el dolor regrese, haga los ejercicios una vez por da o como se lo haya indicado el mdico. Ejercicios Rodilla al pecho Repita estos pasos 3 o 5veces seguidas con cada pierna: 1. Acustese boca arriba sobre una cama dura o sobre el suelo con las piernas extendidas. 2. Lleve una rodilla al pecho. 3. Agarre la rodilla o el muslo con ambas manos y sostngalo en su lugar. 4. Tire de la rodilla hasta sentir una elongacin suave en la parte baja de la espalda o las nalgas. 5. Mantenga la elongacin durante 10 a 30segundos. 6. Suelte y extienda la pierna lentamente. Inclinacin de la pelvis Repita estos pasos 5 o 10veces seguidas: 1. Acustese boca arriba sobre una cama dura o sobre el suelo con las piernas extendidas. 2. Flexione las rodillas de manera que apunten al techo. Los pies deben estar apoyados en el suelo. 3. Contraiga los msculos de la parte baja del vientre (abdomen) para empujar la zona lumbar contra el suelo. Este  movimiento har que el coxis apunte hacia el techo, en lugar de apuntar hacia abajo en direccin a los pies o al suelo. 4. Mantenga esta posicin durante 5 a 10segundos mientras contrae suavemente los msculos y respira con normalidad. El perro y el gato Repita estos pasos hasta que la zona lumbar se curve con ms facilidad: 1. [place] manos y las rodillas sobre una superficie firme. Las manos deben estar alineadas con los hombros y las rodillas con las caderas. Puede colocarse almohadillas debajo de las rodillas. 2. Deje que la cabeza cuelgue hacia el pecho. Tense (contraiga) los msculos del vientre. Baje el coxis en direccin al suelo de modo que la zona lumbar se arquee como el lomo de un [place]. 3. Mantenga esta posicin durante 5segundos. 4. [place] lentamente la cabeza. Relaje los msculos del vientre. Eleve el coxis de modo que apunte en direccin al techo para que la espalda forme un arco hundido como el lomo de un perro contento. 5. Mantenga esta posicin durante 5segundos.  Flexiones de brazos Repita estos pasos 5 o 10veces seguidas: 1. Acustese boca abajo en el suelo. [place] manos cerca de la cabeza, separadas aproximadamente al ancho de los hombros. 3. Con la espalda relajada y las caderas apoyadas en el suelo, extienda lentamente los brazos para levantar la mitad superior del cuerpo y Community education officer los hombros. No use los msculos de la espalda. Puede cambiar la ubicacin de las manos  para estar ms cmodo. 4. Mantenga esta posicin durante 5segundos. 5. Lentamente vuelva a la posicin horizontal.  Puentes Repita estos pasos 10veces seguidas: 1. Acustese boca arriba sobre una superficie firme. 2. Flexione las rodillas de manera que apunten al techo. Los pies deben estar apoyados en el suelo. Los brazos deben estar paralelos a los costados del cuerpo, cerca del cuerpo. 3. Contraiga los glteos y despegue las nalgas del suelo hasta que la cintura est  casi a la altura de las rodillas. Si no siente el trabajo muscular en las nalgas y la parte posterior de los muslos, aleje los pies 1 o 2pulgadas (2.5 o 5centmetros) de las nalgas. 4. Mantenga esta posicin durante 3 a 5segundos. 5. Lentamente, vuelva a apoyar las nalgas en el suelo y relaje los glteos. Si este ejercicio le resulta muy fcil, intente realizarlo con los brazos cruzados [place]. Abdominales Repita estos pasos 5 o 10veces seguidas: 1. Acustese boca arriba sobre una cama dura o sobre el suelo con las piernas extendidas. 2. Flexione las rodillas de manera que apunten al techo. Los pies deben estar apoyados en el suelo. 3. Cruce los [employer]. 4. Baje levemente el mentn en direccin al pecho, pero no doble el cuello. 5. Contraiga los msculos del abdomen y con lentitud eleve el pecho lo suficiente como para despegar levemente los omplatos del suelo. Evite levantar el cuerpo ms alto que eso, porque puede sobreexigir la zona lumbar. 6. Lentamente baje el pecho y la cabeza hasta el suelo. Elevaciones de espalda Repita estos pasos 5 o 10veces seguidas: 1. Acustese boca abajo con los brazos a los costados y apoye la frente en el suelo. 2. Contraiga los msculos de las piernas y los glteos. 3. Lentamente despegue el pecho del suelo mientras mantiene las caderas apoyadas en el suelo. Mantenga la nuca alineada con la curvatura de la espalda. Mire hacia el suelo mientras hace este ejercicio. 4. Mantenga esta posicin durante 3 a 5segundos. 5. Lentamente baje el pecho y el rostro hasta el suelo. Comunquese con un mdico si:  El dolor de espalda se vuelve mucho ms intenso cuando hace un ejercicio.  El dolor de espalda no mejora 2horas despus de [employer] ejercicios. Si tiene alguno de [employer], deje de [employer] ejercicios. No vuelva a hacer los ejercicios a menos que el mdico lo autorice. Solicite ayuda inmediatamente si:  Siente un dolor  sbito y muy intenso en la espalda. Si esto ocurre, deje de [employer]. No vuelva a hacer los ejercicios a menos que el mdico lo autorice. Esta informacin no tiene Marine scientist el consejo del mdico. Asegrese de hacerle al mdico cualquier pregunta que tenga. Document Released: 09/09/2010 Document Revised: 03/24/2018 Document Reviewed: 03/24/2018 Elsevier Patient Education  2020 [employer].

## 2019-02-10 ENCOUNTER — Other Ambulatory Visit: Payer: Self-pay

## 2019-02-10 ENCOUNTER — Encounter: Payer: Self-pay | Admitting: Medical

## 2019-02-10 ENCOUNTER — Ambulatory Visit: Payer: BC Managed Care – PPO | Admitting: Medical

## 2019-02-10 VITALS — BP 136/86 | HR 69 | Temp 97.9°F | Resp 16 | Ht 66.0 in | Wt 223.8 lb

## 2019-02-10 DIAGNOSIS — F419 Anxiety disorder, unspecified: Secondary | ICD-10-CM

## 2019-02-10 DIAGNOSIS — G4700 Insomnia, unspecified: Secondary | ICD-10-CM | POA: Diagnosis not present

## 2019-02-10 DIAGNOSIS — M545 Low back pain, unspecified: Secondary | ICD-10-CM

## 2019-02-10 DIAGNOSIS — M4696 Unspecified inflammatory spondylopathy, lumbar region: Secondary | ICD-10-CM | POA: Diagnosis not present

## 2019-02-10 MED ORDER — SERTRALINE HCL 25 MG PO TABS: 25.0000 mg | ORAL_TABLET | Freq: Every day | ORAL | 0 refills | Status: DC

## 2019-02-10 MED ORDER — TRAZODONE HCL 50 MG PO TABS: 25.0000 mg | ORAL_TABLET | Freq: Every evening | ORAL | 3 refills | Status: DC | PRN

## 2019-02-10 MED ORDER — SERTRALINE HCL 25 MG PO TABS: 25.0000 mg | ORAL_TABLET | Freq: Every day | ORAL | 3 refills | Status: DC

## 2019-02-10 NOTE — Patient Instructions (Addendum)
Glad to hear that your back pain has decreased by about 50%.  Hopefully the orthopedist injection will decrease your pain further.  Continue Tylenol and medication he prescribed today.  For your anxiety and insomnia, I want you to continue low-dose sertraline 25 mg and use trazodone at night.  Would recommend using just 1/2 tablet of trazodone as you begin.  In about a weeks you could increase to 1 whole tablet if needed.  I think if you stay on low doses of these medications you will not have any side effects/interaction.  But I do want you to be aware of serotonin syndrome and review the below information.  If you have any symptoms and stop both medications and let me know.  Would recommend just using 5 mg melatonin at night.  Follow-up in 1 month or as needed. Sndrome de la serotonina Serotonin Syndrome La serotonina es una sustancia qumica en el cuerpo (neurotransmisor) que ayuda a Chief Technology Officer varias funciones, por ejemplo:  La funcin de las clulas del sistema nervioso y del cerebro.  El [place] de nimo y las emociones.  La memoria.  Comer.  Dormir.  La actividad sexual.  [place] al estrs. Tener demasiada serotonina en el organismo pueden causar el sndrome de la serotonina. Esta afeccin puede ser perjudicial para las clulas del sistema nervioso y del cerebro. Puede ser Ardelia Mems afeccin potencialmente mortal. Cules son las causas? Esta afeccin puede ocurrir al tomar Dynegy o consumir drogas que aumentan el nivel de serotonina en el [place], tales como:  Antidepresivos.  Medicamentos para la migraa.  Determinados analgsicos.  Determinadas drogas, entre ellas, xtasis, LSD, cocana y anfetaminas.  Medicamentos de venta libre para la tos o para el resfro que contienen dextrometorfano.  Determinados suplementos herbarios, entre ellos, [place], ginseng y [place]. Generalmente, esta afeccin ocurre cuando estos medicamentos y estas drogas se  toman juntos, pero tambin puede presentarse con una dosis alta de un nico medicamento o una nica droga. Qu incrementa el riesgo? Es ms probable que usted sufra esta afeccin si:  Acaba de empezar a tomar un medicamento o una droga que aumenta el nivel de serotonina en el organismo.  Recientemente, ha aumentado la dosis de Ecologist o droga que aumenta el nivel de serotonina en el organismo.  Toma ms de un medicamento o droga que aumenta el nivel de serotonina en el organismo. Cules son los signos o los sntomas? Generalmente, los sntomas de esta afeccin se manifiestan despus de varias horas de tomar el medicamento o la droga. Los sntomas pueden ser leves o graves. Los sntomas leves incluyen:  Sudoracin.  Inquietud o agitacin.  Rigidez o temblores musculares.  Frecuencia cardaca acelerada.  Nuseas y vmitos.  Diarrea.  Dolor de [place].  [place].  Confusin. Entre los sntomas graves, se incluyen los siguientes:  [place] Licensed conveyancer.  Convulsiones.  Prdida del conocimiento.  Fiebre alta. Cmo se diagnostica? Esta afeccin se puede diagnosticar en funcin de lo siguiente:  Sus antecedentes mdicos.  Un examen fsico.  Su uso anterior de drogas y medicamentos.  Anlisis de [place] y [place]. Estos pueden servir para descartar otras causas de sus sntomas. Cmo se trata? El tratamiento de esta afeccin depende de la gravedad de los sntomas.  Cuando los casos son leves, a menudo todo lo que se necesita es suspender el medicamento o la droga que caus la afeccin.  Cuando los casos son moderados o graves, puede ser necesario el tratamiento en  un hospital para prevenir o controlar sntomas potencialmente mortales. Esto puede incluir medicamentos para controlar los sntomas, lquidos intravenosos (i.v.), intervenciones para ayudar con la respiracin y tratamientos para Scientist, clinical (histocompatibility and immunogenetics). Siga estas  indicaciones en su casa: Orr los medicamentos de venta libre y los recetados solamente como se lo haya indicado el mdico. Esto es importante.  Consulte al mdico antes de tomar medicamentos recetados o de venta libre, hierbas o suplementos por primera vez.  No combine ningn medicamento que pueda causar esta afeccin. Estilo de vida   Lleve un estilo de vida saludable. ? Siga una dieta saludable que incluya abundantes frutas, verduras, cereales integrales, productos lcteos descremados y protenas magras. No consuma muchos alimentos con alto contenido de grasas, azcares agregados o sal. ? Duerma bien y por el tiempo adecuado. La [employer] de los adultos necesitan entre [id number] de sueo todas las noches. ? Dedique tiempo a Energy manager, aunque sea solo por cortos perodos de [place]. La mayora de los adultos deben hacer ejercicio durante al menos 142minutos por semana. ? No beba alcohol. ? No consuma drogas ilegales y no tome medicamentos por otros motivos para los cuales no hayan sido recetados. Instrucciones generales  No consuma ningn producto que contenga nicotina o tabaco, como cigarrillos y Psychologist, sport and exercise. Si necesita ayuda para dejar de fumar, consulte al mdico.  Concurra a todas las visitas de seguimiento como se lo haya indicado el mdico. Esto es importante. Comunquese con un mdico si:  Los sntomas no mejoran o empeoran. Solicite ayuda inmediatamente si:  Tiene confusin que [place], dolor de cabeza intenso, dolor de [place], fiebre alta, convulsiones o prdida de la conciencia.  Los [employer] producen efectos secundarios graves, como hinchazn del rostro, los labios, la lengua o Patent examiner.  Tiene pensamientos graves acerca de Glass blower/designer a s mismo o a Producer, television/film/video. Estos sntomas pueden representar un problema grave que constituye Engineer, maintenance (IT). No espere hasta que los sntomas desaparezcan. Solicite atencin mdica  de inmediato. Comunquese con el servicio de emergencias de su localidad (911 en los [place]). No conduzca por sus propios medios Principal Financial. Si alguna vez siente que puede lastimarse a usted mismo o a Producer, television/film/video, o tiene pensamientos de poner fin a su vida, busque ayuda de inmediato. Puede dirigirse al servicio de emergencias ms cercano o comunicarse con:  El servicio de emergencias de su localidad (911 en [place]  Una lnea de asistencia al suicida y Freight forwarder en crisis, como la [employer] de Prevencin del Suicidio (National Suicide Prevention Lifeline), al [phone]. Est disponible las 24 horas del da. Resumen  La serotonina es una sustancia qumica del cerebro que ayuda a regular el sistema nervioso. Los [employer] altos de serotonina en el organismo pueden causar el sndrome de la serotonina, una afeccin muy peligrosa.  Esta afeccin puede ocurrir al tomar [employer] o consumir drogas que aumentan el nivel de serotonina en el organismo.  El tratamiento variar segn la gravedad de los sntomas. Cuando los casos son leves, a menudo todo lo que se necesita es suspender el medicamento o la droga que caus la afeccin.  Consulte al mdico antes de tomar medicamentos recetados o de venta libre, hierbas o suplementos por primera vez. Esta informacin no tiene Marine scientist el consejo del mdico. Asegrese de hacerle al mdico cualquier pregunta que tenga. Document Released: 05/25/2005 Document Revised: 07/31/2017 Document Reviewed: 07/31/2017 Elsevier Patient Education  2020 [employer].

## 2019-02-10 NOTE — Progress Notes (Signed)
Subjective:    Patient ID: Wayne Miller, male    DOB: 03-Oct-1980, 38 y.o.   MRN: [id number]  HPI  Pt in for follow up.  Pt states his back pain still present about 50%.   Pt saw Dr. Lenord Fellers ortho. He will get injection in back. He will get injection on sept 25, 2020.  Pt has been using tylenol for back pain. Dr. Lynann Bologna gave sample of possible nsaid. He can't remember name. He will start that today.  Pt states he is still anxious and has trouble sleeping. Has to take 5 mg melatonin but takes 25 mg at night. He states will sleep about 6 hours straight.   Sertraline did help day time anxiety.   Review of Systems  Constitutional: Negative for diaphoresis and fever.  Respiratory: Negative for cough, choking, shortness of breath and wheezing.   Cardiovascular: Negative for chest pain and palpitations.  Gastrointestinal: Negative for abdominal pain.  Musculoskeletal: Negative for back pain.  Skin: Negative for rash.  Hematological: Negative for adenopathy. Does not bruise/bleed easily.  Psychiatric/Behavioral: Positive for sleep disturbance. Negative for behavioral problems and confusion. The patient is nervous/anxious.     Past Medical History:  Diagnosis Date  . Blood in stool   . GERD (gastroesophageal reflux disease)   . Herniated nucleus pulposis of lumbosacral region   . Multiple gastric ulcers      Social History   Socioeconomic History  . Marital status: Married    Spouse name: Not on file  . Number of children: Not on file  . Years of education: Not on file  . Highest education level: Not on file  Occupational History  . Not on file  Social Needs  . Financial resource strain: Not on file  . Food insecurity    Worry: Not on file    Inability: Not on file  . Transportation needs    Medical: Not on file    Non-medical: Not on file  Tobacco Use  . Smoking status: Former Smoker    Types: Cigarettes    Quit date: 08/29/2005    Years since quitting:  13.4  . Smokeless tobacco: Never Used  Substance and Sexual Activity  . Alcohol use: Not Currently    Comment: past -not now  . Drug use: No  . Sexual activity: Yes  Lifestyle  . Physical activity    Days per week: Not on file    Minutes per session: Not on file  . Stress: Not on file  Relationships  . Social Herbalist on phone: Not on file    Gets together: Not on file    Attends religious service: Not on file    Active member of club or organization: Not on file    Attends meetings of clubs or organizations: Not on file    Relationship status: Not on file  . Intimate partner violence    Fear of current or ex partner: Not on file    Emotionally abused: Not on file    Physically abused: Not on file    Forced sexual activity: Not on file  Other Topics Concern  . Not on file  Social History Narrative   ** Merged History Encounter **        Past Surgical History:  Procedure Laterality Date  . KNEE ARTHROSCOPY Left    left knee scope "tear repair"  . LUMBAR LAMINECTOMY/DECOMPRESSION MICRODISCECTOMY Right 09/11/2015   Procedure: MICRO LUMBAR DECOMPRESSION OF RIGHT L4-5  AND L5-S1 AND RIGHT FORAMINOTOMY L5 (2 LEVELS);  Surgeon: Susa Day, MD;  Location: [facility];  Service: Orthopedics;  Laterality: Right;    Family History  Problem Relation Age of Onset  . Arthritis Mother   . Diabetes Mother   . Hyperlipidemia Mother   . Hypertension Mother   . Arthritis Father   . Depression Father   . Diabetes Father   . Hypertension Father   . Kidney disease Father   . Arthritis Maternal Grandmother   . Hypertension Maternal Grandmother   . Alcohol abuse Maternal Grandfather   . Prostate cancer Maternal Grandfather   . Arthritis Paternal Grandmother   . Birth defects Brother     Allergies  Allergen Reactions  . Ibuprofen Other (See Comments)    G I upset    Current Outpatient Medications on File Prior to Visit  Medication Sig Dispense Refill  . clonazePAM  (KLONOPIN) 1 MG tablet Take 1 tablet (1 mg total) by mouth 2 (two) times daily as needed for anxiety. 14 tablet 0  . cyclobenzaprine (FLEXERIL) 5 MG tablet Take 1 tablet (5 mg total) by mouth at bedtime. 5 tablet 0  . sertraline (ZOLOFT) 25 MG tablet Take 1 tablet (25 mg total) by mouth daily. 30 tablet 0   No current facility-administered medications on file prior to visit.     BP 136/86   Pulse 69   Temp 97.9 F (36.6 C) (Oral)   Resp 16   Ht 5\' 6"  (1.676 m)   Wt 223 lb 12.8 oz (101.5 kg)   SpO2 99%   BMI 36.12 kg/m       Objective:   Physical Exam  General Mental Status- Alert. General Appearance- Not in acute distress.   Skin General: Color- Normal Color. Moisture- Normal Moisture.  Neck Carotid Arteries- Normal color. Moisture- Normal Moisture. No carotid bruits. No JVD.  Chest and Lung Exam Auscultation: Breath Sounds:-Normal.  Cardiovascular Auscultation:Rythm- Regular. Murmurs & Other Heart Sounds:Auscultation of the heart reveals- No Murmurs.  Abdomen Inspection:-Inspeection Normal. Palpation/Percussion:Note:No mass. Palpation and Percussion of the abdomen reveal- Non Tender, Non Distended + BS, no rebound or guarding.  Neurologic Cranial Nerve exam:- CN III-XII intact(No nystagmus), symmetric smile. Strength:- 5/5 equal and symmetric strength both upper and lower extremities.      Assessment & Plan:  Glad to hear that your back pain has decreased by about 50%.  Hopefully the orthopedist injection will decrease your pain further.  Continue Tylenol and medication he prescribed today.  For your anxiety and insomnia, I want you to continue low-dose sertraline 25 mg and use trazodone at night.  Would recommend using just 1/2 tablet of trazodone as you begin.  In about a weeks you could increase to 1 whole tablet if needed.  I think if you stay on low doses of these medications you will not have any side effects/interaction.  But I do want you to be aware of  serotonin syndrome and review the below information.  If you have any symptoms and stop both medications and let me know.  Would recommend just using 5 mg melatonin at night.  Follow-up in 1 month or as needed.   25 mintues spent with pt. 50% of time spent counseling on dx and tx Plan going forward and answered pt questions.Mackie Pai, PA-C

## 2019-03-03 DIAGNOSIS — M47816 Spondylosis without myelopathy or radiculopathy, lumbar region: Secondary | ICD-10-CM | POA: Diagnosis not present

## 2019-03-17 ENCOUNTER — Encounter: Payer: Self-pay | Admitting: Medical

## 2019-03-17 ENCOUNTER — Ambulatory Visit: Payer: BC Managed Care – PPO | Admitting: Medical

## 2019-03-17 ENCOUNTER — Other Ambulatory Visit: Payer: Self-pay

## 2019-03-17 VITALS — BP 126/78 | HR 64 | Temp 98.4°F | Resp 16 | Ht 66.0 in | Wt 228.2 lb

## 2019-03-17 DIAGNOSIS — F419 Anxiety disorder, unspecified: Secondary | ICD-10-CM

## 2019-03-17 DIAGNOSIS — G4700 Insomnia, unspecified: Secondary | ICD-10-CM

## 2019-03-17 MED ORDER — MIRTAZAPINE 15 MG PO TABS: 15.0000 mg | ORAL_TABLET | Freq: Every day | ORAL | 0 refills | Status: DC

## 2019-03-17 NOTE — Progress Notes (Signed)
Subjective:    Patient ID: Wayne Miller, male    DOB: [birth year], 38 y.o.   MRN: [id number]  HPI  Pt in for follow up.  Pt still has some back pain. Pt is seeing orthopedist. Injection recommended. He is waiting for prior authorization.  Pt has anxiety and insomnia.  Pt is on sertraline, trazadone and melatonin. He states still can't sleep well.  Pt wants to have new specialist evaluation. Per not he had 88% sleep efficiency on prior study done by neurologist. They thought he needed cognitive behavioral therapy.   Pt in the past tried clonazepam and did not help. He states medications in past did not help him sleep.     Review of Systems  Constitutional: Negative for chills, fatigue and fever.  Respiratory: Negative for cough, chest tightness, shortness of breath and wheezing.   Cardiovascular: Negative for chest pain and palpitations.  Gastrointestinal: Negative for abdominal distention and abdominal pain.  Musculoskeletal: Negative for gait problem, myalgias and neck stiffness.  Skin: Negative for rash.  Neurological: Negative for dizziness, tremors, weakness, light-headedness and headaches.  Hematological: Negative for adenopathy. Does not bruise/bleed easily.  Psychiatric/Behavioral: Positive for sleep disturbance. Negative for agitation, behavioral problems, dysphoric mood and suicidal ideas. The patient is nervous/anxious.     Past Medical History:  Diagnosis Date  . Blood in stool   . GERD (gastroesophageal reflux disease)   . Herniated nucleus pulposis of lumbosacral region   . Multiple gastric ulcers      Social History   Socioeconomic History  . Marital status: Married    Spouse name: Not on file  . Number of children: Not on file  . Years of education: Not on file  . Highest education level: Not on file  Occupational History  . Not on file  Social Needs  . Financial resource strain: Not on file  . Food insecurity    Worry: Not on file   Inability: Not on file  . Transportation needs    Medical: Not on file    Non-medical: Not on file  Tobacco Use  . Smoking status: Former Smoker    Types: Cigarettes    Quit date: 08/29/2005    Years since quitting: 13.5  . Smokeless tobacco: Never Used  Substance and Sexual Activity  . Alcohol use: Not Currently    Comment: past -not now  . Drug use: No  . Sexual activity: Yes  Lifestyle  . Physical activity    Days per week: Not on file    Minutes per session: Not on file  . Stress: Not on file  Relationships  . Social Herbalist on phone: Not on file    Gets together: Not on file    Attends religious service: Not on file    Active member of club or organization: Not on file    Attends meetings of clubs or organizations: Not on file    Relationship status: Not on file  . Intimate partner violence    Fear of current or ex partner: Not on file    Emotionally abused: Not on file    Physically abused: Not on file    Forced sexual activity: Not on file  Other Topics Concern  . Not on file  Social History Narrative   ** Merged History Encounter **        Past Surgical History:  Procedure Laterality Date  . KNEE ARTHROSCOPY Left    left knee scope "tear  repair"  . LUMBAR LAMINECTOMY/DECOMPRESSION MICRODISCECTOMY Right 09/11/2015   Procedure: MICRO LUMBAR DECOMPRESSION OF RIGHT L4-5 AND L5-S1 AND RIGHT FORAMINOTOMY L5 (2 LEVELS);  Surgeon: Susa Day, MD;  Location: [facility];  Service: Orthopedics;  Laterality: Right;    Family History  Problem Relation Age of Onset  . Arthritis Mother   . Diabetes Mother   . Hyperlipidemia Mother   . Hypertension Mother   . Arthritis Father   . Depression Father   . Diabetes Father   . Hypertension Father   . Kidney disease Father   . Arthritis Maternal Grandmother   . Hypertension Maternal Grandmother   . Alcohol abuse Maternal Grandfather   . Prostate cancer Maternal Grandfather   . Arthritis Paternal Grandmother    . Birth defects Brother     Allergies  Allergen Reactions  . Ibuprofen Other (See Comments)    G I upset    Current Outpatient Medications on File Prior to Visit  Medication Sig Dispense Refill  . sertraline (ZOLOFT) 25 MG tablet Take 1 tablet (25 mg total) by mouth daily. 30 tablet 3  . sertraline (ZOLOFT) 25 MG tablet Take 1 tablet (25 mg total) by mouth daily. 30 tablet 0   No current facility-administered medications on file prior to visit.     BP 126/78   Pulse 64   Temp 98.4 F (36.9 C) (Temporal)   Resp 16   Ht 5\' 6"  (1.676 m)   Wt 228 lb 3.2 oz (103.5 kg)   SpO2 99%   BMI 36.83 kg/m       Objective:   Physical Exam  General Mental Status- Alert. General Appearance- Not in acute distress.   Chest and Lung Exam Auscultation: Breath Sounds:-Normal.  Cardiovascular Auscultation:Rythm- Regular. Murmurs & Other Heart Sounds:Auscultation of the heart reveals- No Murmurs.   Neurologic Cranial Nerve exam:- CN III-XII intact(No nystagmus), symmetric smile. Strength:- 5/5 equal and symmetric strength both upper and lower extremities.      Assessment & Plan:  For probable anxiety and reported insomnia, dc trazadone. Continue sertraline and will add remeron. Rx advisement. Education information given on rare potential side effect.   Will refer to behavioral health as well as pulmonologist.(Get second opinion from pulmonologist on your insomnia.)  Follow up in 3 weeks or as needed  25 minutes spent with pt. 50% of time spent with pt counseling on plan going forward.

## 2019-03-17 NOTE — Patient Instructions (Signed)
For probable anxiety and reported insomnia, dc trazadone. Continue sertraline and will add remeron. Rx advisement. Education information given on rare potential side effect.   Will refer to behavioral health as well as pulmonologist.(Get second opinion from pulmonologist on your insomnia.)  Follow up in 3 weeks or as needed   Sndrome de la serotonina Serotonin Syndrome La serotonina es una sustancia qumica en el cuerpo (neurotransmisor) que ayuda a Chief Technology Officer varias funciones, por ejemplo:  La funcin de las clulas del sistema nervioso y del cerebro.  El [place] de nimo y las emociones.  La memoria.  Comer.  Dormir.  La actividad sexual.  [place] al estrs. Tener demasiada serotonina en el organismo pueden causar el sndrome de la serotonina. Esta afeccin puede ser perjudicial para las clulas del sistema nervioso y del cerebro. Puede ser Ardelia Mems afeccin potencialmente mortal. Cules son las causas? Esta afeccin puede ocurrir al tomar Dynegy o consumir drogas que aumentan el nivel de serotonina en el [place], tales como:  Antidepresivos.  Medicamentos para la migraa.  Determinados analgsicos.  Determinadas drogas, entre ellas, xtasis, LSD, cocana y anfetaminas.  Medicamentos de venta libre para la tos o para el resfro que contienen dextrometorfano.  Determinados suplementos herbarios, entre ellos, [place], ginseng y [place]. Generalmente, esta afeccin ocurre cuando estos medicamentos y estas drogas se toman juntos, pero tambin puede presentarse con una dosis alta de un nico medicamento o una nica droga. Qu incrementa el riesgo? Es ms probable que usted sufra esta afeccin si:  Acaba de empezar a tomar un medicamento o una droga que aumenta el nivel de serotonina en el organismo.  Recientemente, ha aumentado la dosis de Ecologist o droga que aumenta el nivel de serotonina en el organismo.  Toma ms de un medicamento o droga  que aumenta el nivel de serotonina en el organismo. Cules son los signos o los sntomas? Generalmente, los sntomas de esta afeccin se manifiestan despus de varias horas de tomar el medicamento o la droga. Los sntomas pueden ser leves o graves. Los sntomas leves incluyen:  Sudoracin.  Inquietud o agitacin.  Rigidez o temblores musculares.  Frecuencia cardaca acelerada.  Nuseas y vmitos.  Diarrea.  Dolor de [place].  [place].  Confusin. Entre los sntomas graves, se incluyen los siguientes:  Latidos Licensed conveyancer.  Convulsiones.  Prdida del conocimiento.  Fiebre alta. Cmo se diagnostica? Esta afeccin se puede diagnosticar en funcin de lo siguiente:  Sus antecedentes mdicos.  Un examen fsico.  Su uso anterior de drogas y medicamentos.  Anlisis de [place] y [place]. Estos pueden servir para descartar otras causas de sus sntomas. Cmo se trata? El tratamiento de esta afeccin depende de la gravedad de los sntomas.  Cuando los casos son leves, a menudo todo lo que se necesita es suspender el medicamento o la droga que caus la afeccin.  Cuando los casos son moderados o graves, puede ser necesario el tratamiento en un hospital para prevenir o controlar sntomas potencialmente mortales. Esto puede incluir medicamentos para controlar los sntomas, lquidos intravenosos (i.v.), intervenciones para ayudar con la respiracin y tratamientos para Scientist, clinical (histocompatibility and immunogenetics). Siga estas indicaciones en su casa: [place] los medicamentos de venta libre y los recetados solamente como se lo haya indicado el mdico. Esto es importante.  Consulte al mdico antes de tomar medicamentos recetados o de venta libre, hierbas o suplementos por primera vez.  No combine ningn medicamento que pueda causar esta afeccin.  Estilo de vida   Lleve un estilo de vida saludable. ? Siga una dieta saludable que incluya  abundantes frutas, verduras, cereales integrales, productos lcteos descremados y protenas magras. No consuma muchos alimentos con alto contenido de grasas, azcares agregados o sal. ? Duerma bien y por el tiempo adecuado. La [employer] de los adultos necesitan entre [id number] de sueo todas las noches. ? Dedique tiempo a Energy manager, aunque sea solo por cortos perodos de [place]. La mayora de los adultos deben hacer ejercicio durante al menos 158minutos por semana. ? No beba alcohol. ? No consuma drogas ilegales y no tome medicamentos por otros motivos para los cuales no hayan sido recetados. Instrucciones generales  No consuma ningn producto que contenga nicotina o tabaco, como cigarrillos y Psychologist, sport and exercise. Si necesita ayuda para dejar de fumar, consulte al mdico.  Concurra a todas las visitas de seguimiento como se lo haya indicado el mdico. Esto es importante. Comunquese con un mdico si:  Los sntomas no mejoran o empeoran. Solicite ayuda inmediatamente si:  Tiene confusin que [place], dolor de cabeza intenso, dolor de [place], fiebre alta, convulsiones o prdida de la conciencia.  Los [employer] producen efectos secundarios graves, como hinchazn del rostro, los labios, la lengua o Patent examiner.  Tiene pensamientos graves acerca de Glass blower/designer a s mismo o a Producer, television/film/video. Estos sntomas pueden representar un problema grave que constituye Engineer, maintenance (IT). No espere hasta que los sntomas desaparezcan. Solicite atencin mdica de inmediato. Comunquese con el servicio de emergencias de su localidad (911 en los [place]). No conduzca por sus propios medios Principal Financial. Si alguna vez siente que puede lastimarse a usted mismo o a Producer, television/film/video, o tiene pensamientos de poner fin a su vida, busque ayuda de inmediato. Puede dirigirse al servicio de emergencias ms cercano o comunicarse con:  El servicio de emergencias de su localidad (911 en  [place]  Una lnea de asistencia al suicida y Freight forwarder en crisis, como la [employer] de Prevencin del Suicidio (National Suicide Prevention Lifeline), al [phone]. Est disponible las 24 horas del da. Resumen  La serotonina es una sustancia qumica del cerebro que ayuda a regular el sistema nervioso. Los [employer] altos de serotonina en el organismo pueden causar el sndrome de la serotonina, una afeccin muy peligrosa.  Esta afeccin puede ocurrir al tomar Dynegy o consumir drogas que aumentan el nivel de serotonina en el organismo.  El tratamiento variar segn la gravedad de los sntomas. Cuando los casos son leves, a menudo todo lo que se necesita es suspender el medicamento o la droga que caus la afeccin.  Consulte al mdico antes de tomar medicamentos recetados o de venta libre, hierbas o suplementos por primera vez. Esta informacin no tiene Marine scientist el consejo del mdico. Asegrese de hacerle al mdico cualquier pregunta que tenga. Document Released: 05/25/2005 Document Revised: 07/31/2017 Document Reviewed: 07/31/2017 Elsevier Patient Education  2020 [employer].

## 2019-03-30 ENCOUNTER — Other Ambulatory Visit: Payer: Self-pay

## 2019-03-30 ENCOUNTER — Encounter: Payer: Self-pay | Admitting: Pulmonary Disease

## 2019-03-30 ENCOUNTER — Ambulatory Visit (INDEPENDENT_AMBULATORY_CARE_PROVIDER_SITE_OTHER): Payer: BC Managed Care – PPO | Admitting: Pulmonary Disease

## 2019-03-30 VITALS — BP 120/70 | HR 63 | Ht 67.0 in | Wt 230.6 lb

## 2019-03-30 DIAGNOSIS — F5104 Psychophysiologic insomnia: Secondary | ICD-10-CM | POA: Diagnosis not present

## 2019-03-30 DIAGNOSIS — F5103 Paradoxical insomnia: Secondary | ICD-10-CM

## 2019-03-30 MED ORDER — ESZOPICLONE 2 MG PO TABS: 2.0000 mg | ORAL_TABLET | Freq: Every evening | ORAL | 1 refills | Status: DC | PRN

## 2019-03-30 NOTE — Patient Instructions (Signed)
Sleep onset and sleep maintenance insomnia  We will try Lunesta  Keep your appointment with the psychotherapist  I am including information for sleep apnea-history of snoring  Apnea del sueo Sleep Apnea La apnea del sueo es una afeccin en la que la respiracin se detiene o se hace superficial mientras duerme. Los episodios de apnea del sueo suelen durar 10 segundos o ms, y pueden ocurrir hasta 20 veces por hora. La apnea del sueo interrumpe el sueo y evita que el cuerpo descanse como lo necesita. Esta afeccin puede aumentar el riesgo de sufrir ciertos problemas de [place], como:  Infarto de miocardio.  Accidente cerebrovascular.  Obesidad.  Diabetes.  Insuficiencia cardaca.  Latidos cardacos irregulares. Cules son las causas? Existen tres tipos de apnea del sueo:  Apnea obstructiva del sueo. Este tipo de apnea ocurre cuando las vas respiratorias se obstruyen o colapsan.  Apnea central del sueo. Este tipo ocurre cuando la parte del cerebro que controla la respiracin no enva las seales correctas a los msculos que controlan la respiracin.  Apnea mixta del sueo. Esta es una combinacin de apnea obstructiva y central del sueo. La causa ms frecuente de esta afeccin es la obstruccin o el colapso de las vas respiratorias. Las vas respiratorias pueden colapsar o bloquearse en los siguientes casos:  Los msculos de la garganta estn anormalmente relajados.  La lengua y las amgdalas son ms grandes que lo normal.  Tiene sobrepeso.  Las vas respiratorias son ms pequeas que lo normal. Qu incrementa el riesgo? Es ms probable que tenga esta afeccin si:  Tiene sobrepeso.  Fuma.  Tiene vas respiratorias ms pequeas que lo normal.  Es de edad avanzada.  Es hombre.  Bebe alcohol.  Toma sedantes o tranquilizantes.  Tiene antecedentes familiares de apnea del sueo. Cules son los signos o los sntomas? Los sntomas de esta afeccin incluyen:   Dificultad para Public affairs consultant dormido.  Somnolencia y Nurse, learning disability.  Irritabilidad.  Ronquidos fuertes.  Dolores de cabeza matutinos.  Dificultad para concentrarse.  Olvidos.  Disminucin del inters por el sexo.  Somnolencia sin motivo aparente.  Cambios en el estado de nimo.  Cambios en la personalidad.  Sentimientos de depresin.  Levantarse con frecuencia durante la noche para ir a Garment/textile technologist.  Sequedad en la boca.  Dolor de Investment banker, operational. Cmo se diagnostica? Esta afeccin se puede diagnosticar con lo siguiente:  Los antecedentes mdicos.  Un examen fsico.  Ardelia Mems serie de pruebas que se realizan [employer] persona duerme (estudio del sueo). Estas pruebas generalmente se hacen en un laboratorio del sueo, pero tambin pueden Development worker, international aid. Cmo se trata? El tratamiento de esta afeccin tiene como objetivo restablecer la respiracin normal y [employer] sntomas durante el sueo. Puede implicar controlar los problemas de salud que pueden afectar la respiracin, como la presin arterial alta o la obesidad. El tratamiento puede incluir:  Dormir de [place].  Si tiene congestin nasal, Tax adviser.  Evitar el consumo de depresores, como el alcohol, sedantes y narcticos.  Si tiene sobrepeso, [employer].  Realizar cambios en la dieta.  Dejar de fumar.  Usar un dispositivo para abrir las vas respiratorias mientras duerme; por ejemplo: ? Un aparato bucal. Se trata de una boquilla hecha a medida que desplaza la mandbula hacia adelante. ? Un dispositivo de presin positiva de las vas areas continua (continuous positive airway pressure, CPAP). Este dispositivo sopla aire a travs de una mscara cuando usted exhala. ? Un dispositivo de presin positiva  de las vas areas espiratoria nasal (expiratory positive airway pressure, EPAP). Este dispositivo tiene vlvulas que se colocan en cada fosa nasal. ? Un dispositivo de presin positiva  de las vas areas [employer] (bi-level positive airway pressure, BPAP). Este dispositivo sopla aire a travs de una mscara cuando usted inhala y exhala.  Someterse a Psychiatric nurse tratamientos no Barista. Durante la ciruga, el exceso de tejido se elimina para Market researcher las vas respiratorias. Realizar un tratamiento para la apnea del sueo es importante. Sin el tratamiento [place], esta afeccin puede causar lo siguiente:  Presin arterial alta.  Arteriopata coronaria.  En los hombres, incapacidad para alcanzar o Copy (impotencia).  Reduccin de las habilidades de pensamiento. Siga estas instrucciones en su casa: Estilo de vida  Haga cambios en su estilo de vida segn las recomendaciones de su mdico.  Consuma una dieta sana y [place].  Si tiene sobrepeso, tome medidas para bajar de [place].  Evite el uso de depresores, como el alcohol, sedantes y narcticos.  No consuma ningn producto que contenga nicotina o tabaco, como cigarrillos, cigarrillos electrnicos y tabaco de Higher education careers adviser. Si necesita ayuda para dejar de fumar, consulte al [employer]. Instrucciones generales  [employer] de venta libre y los recetados solamente como se lo haya indicado el mdico.  Si le proporcionaron un dispositivo para abrir las vas respiratorias mientras duerme, selo solamente como se lo haya indicado el mdico.  Si va a someterse a una ciruga, no olvide informarle al mdico que tiene apnea del sueo. Puede ser necesario que lleve su dispositivo consigo.  Concurra a todas las visitas de seguimiento como se lo haya indicado el mdico. Esto es importante. Comunquese con un mdico si:  El dispositivo que le proporcionaron para abrir las vas respiratorias mientras duerme es incmodo o no parece funcionar.  Sus sntomas no mejoran.  Sus sntomas empeoran. Solicite ayuda inmediatamente si:  Tiene los siguientes signos y sntomas: ? Neurosurgeon. ? Falta de aire. ? [employer], los brazos o el [place].  Tiene lo siguiente: ? Dificultad para hablar. ? Debilidad en un lado del cuerpo. ? Se le cae el rostro. Estos sntomas pueden representar un problema grave que constituye Engineer, maintenance (IT). No espere a ver si los sntomas desaparecen. Solicite atencin mdica de inmediato. Comunquese con el servicio de emergencias de su localidad (911 en los [place]). No conduzca por sus propios medios [employer] hospital. Resumen  La apnea del sueo es una afeccin en la que la respiracin se detiene o se hace superficial mientras duerme.  La causa ms frecuente es la obstruccin o el colapso de las vas respiratorias.  El objetivo del tratamiento es restablecer la respiracin normal y Public house manager los sntomas durante el sueo. Esta informacin no tiene Marine scientist el consejo del mdico. Asegrese de hacerle al mdico cualquier pregunta que tenga. Document Released: 03/04/2005 Document Revised: 02/16/2018 Document Reviewed: 02/16/2018 Elsevier Patient Education  2020 [employer].

## 2019-03-30 NOTE — Progress Notes (Signed)
Subjective:    Patient ID: Wayne Miller, male    DOB: [birth year], 38 y.o.   MRN: [id number]  Patient with a history of insomnia Has been having sleep onset and sleep maintenance insomnia Onset was following his back surgery in April 2017  He is active during the day Not tired during the day Not able to fall asleep and stay asleep  Usual bedtime about 11 PM Wakes up a lot during the night  Final awakening time about 5 AM  He denies having significant pain or discomfort contributing to inability to sleep  Denies having anxiety But he does appear to ruminate about his sleep a lot  He denies daytime symptoms of sleepiness or fatigue  He has tried Ambien in the past, did not help  He continues to feel that what ever was done on his back continues to contribute to his inability to sleep well  Admits to snoring-happens rarely according to spouse, no witnessed apneas No dryness of his mouth No headaches in the mornings  Past Medical History:  Diagnosis Date  . Blood in stool   . GERD (gastroesophageal reflux disease)   . Herniated nucleus pulposis of lumbosacral region   . Multiple gastric ulcers     Social History   Socioeconomic History  . Marital status: Married    Spouse name: Not on file  . Number of children: Not on file  . Years of education: Not on file  . Highest education level: Not on file  Occupational History  . Not on file  Social Needs  . Financial resource strain: Not on file  . Food insecurity    Worry: Not on file    Inability: Not on file  . Transportation needs    Medical: Not on file    Non-medical: Not on file  Tobacco Use  . Smoking status: Former Smoker    Types: Cigarettes    Quit date: 08/29/2005    Years since quitting: 13.5  . Smokeless tobacco: Never Used  Substance and Sexual Activity  . Alcohol use: Not Currently    Comment: past -not now  . Drug use: No  . Sexual activity: Yes  Lifestyle  . Physical activity    Days  per week: Not on file    Minutes per session: Not on file  . Stress: Not on file  Relationships  . Social Herbalist on phone: Not on file    Gets together: Not on file    Attends religious service: Not on file    Active member of club or organization: Not on file    Attends meetings of clubs or organizations: Not on file    Relationship status: Not on file  . Intimate partner violence    Fear of current or ex partner: Not on file    Emotionally abused: Not on file    Physically abused: Not on file    Forced sexual activity: Not on file  Other Topics Concern  . Not on file  Social History Narrative   ** Merged History Encounter **       Family History  Problem Relation Age of Onset  . Arthritis Mother   . Diabetes Mother   . Hyperlipidemia Mother   . Hypertension Mother   . Arthritis Father   . Depression Father   . Diabetes Father   . Hypertension Father   . Kidney disease Father   . Arthritis Maternal Grandmother   .  Hypertension Maternal Grandmother   . Alcohol abuse Maternal Grandfather   . Prostate cancer Maternal Grandfather   . Arthritis Paternal Grandmother   . Birth defects Brother    Review of Systems  Constitutional: Positive for unexpected weight change. Negative for fever.  HENT: Negative for congestion, dental problem, ear pain, nosebleeds, postnasal drip, rhinorrhea, sinus pressure, sneezing, sore throat and trouble swallowing.   Eyes: Negative for redness and itching.  Respiratory: Negative for cough, chest tightness, shortness of breath and wheezing.   Cardiovascular: Negative for palpitations and leg swelling.  Gastrointestinal: Negative for nausea and vomiting.  Genitourinary: Negative for dysuria.  Musculoskeletal: Negative for joint swelling.  Skin: Negative for rash.  Allergic/Immunologic: Negative.  Negative for environmental allergies, food allergies and immunocompromised state.  Neurological: Negative for headaches.   Hematological: Does not bruise/bleed easily.  Psychiatric/Behavioral: Negative for dysphoric mood. The patient is not nervous/anxious.        Objective:   Physical Exam Vitals signs reviewed.  Constitutional:      Appearance: He is obese.  HENT:     Head: Normocephalic.     Nose: Nose normal. No congestion.     Mouth/Throat:     Mouth: Mucous membranes are moist.     Comments: Mallampati 3, crowded oropharynx Eyes:     General:        Right eye: No discharge.        Left eye: No discharge.     Pupils: Pupils are equal, round, and reactive to light.  Neck:     Musculoskeletal: Normal range of motion and neck supple. No neck rigidity or muscular tenderness.     Comments: Neck is thick Cardiovascular:     Pulses: Normal pulses.     Heart sounds: Normal heart sounds. No murmur. No friction rub.  Pulmonary:     Effort: Pulmonary effort is normal. No respiratory distress.     Breath sounds: Normal breath sounds. No stridor. No wheezing or rhonchi.  Musculoskeletal: Normal range of motion.        General: No swelling.  Skin:    General: Skin is warm.     Coloration: Skin is not jaundiced or pale.  Neurological:     General: No focal deficit present.     Mental Status: He is alert.     Cranial Nerves: No cranial nerve deficit.  Psychiatric:        Mood and Affect: Mood normal.    Vitals:   03/30/19 1614  BP: 120/70  Pulse: 63  SpO2: 97%   Results of the Epworth flowsheet 03/30/2019  Sitting and reading 0  Watching TV 0  Sitting, inactive in a public place (e.g. a theatre or a meeting) 0  As a passenger in a car for an hour without a break 0  Lying down to rest in the afternoon when circumstances permit 0  Sitting and talking to someone 0  Sitting quietly after a lunch without alcohol 0  In a car, while stopped for a few minutes in traffic 0  Total score 0          Assessment & Plan:  Insomnia .  Has sleep onset and sleep maintenance insomnia .  Denies  daytime fatigue or sleepiness .  Ruminates about his inability to sleep  Paradoxical insomnia is likely in this situation as he does not have any daytime symptoms Keeping a sleep log will be the best way of figuring this out however, very difficult to convince  him that he is getting some sleep and that his back surgery is not continuing to contribute to his difficulty sleeping Denies having significant daytime symptoms on multiple occasions when he was asked  Obesity .  Has gained weight recently  Snoring .  Admits to rare snoring .  Does not appear to have any other symptoms suggesting sleep disordered breathing .  He does have crowded oropharynx .  There is a possibility of presence of sleep disordered breathing  Anxiety .  Rumination about his sleep may be contributing to his insomnia  He does have an appointment to see a psychotherapist  .  Trial with Lunesta 2 mg nightly .  I did advise him that I does not have to use it every night .  Increase activity during the day-he states he is very physically active at work .  He does not feel fatigued  .  Cognitive behavioral therapy may be considered, this may help .  Was unable to dissuade him from the fact that his back surgery may not be contributing to his insomnia, may have been the trigger for the initial insomnia

## 2019-04-13 ENCOUNTER — Ambulatory Visit: Payer: BC Managed Care – PPO | Admitting: Psychology

## 2019-05-21 DIAGNOSIS — R509 Fever, unspecified: Secondary | ICD-10-CM | POA: Diagnosis not present

## 2019-05-21 DIAGNOSIS — Z20828 Contact with and (suspected) exposure to other viral communicable diseases: Secondary | ICD-10-CM | POA: Diagnosis not present

## 2019-08-28 DIAGNOSIS — M47816 Spondylosis without myelopathy or radiculopathy, lumbar region: Secondary | ICD-10-CM | POA: Diagnosis not present

## 2019-09-01 DIAGNOSIS — M9901 Segmental and somatic dysfunction of cervical region: Secondary | ICD-10-CM | POA: Diagnosis not present

## 2019-09-01 DIAGNOSIS — M9903 Segmental and somatic dysfunction of lumbar region: Secondary | ICD-10-CM | POA: Diagnosis not present

## 2019-09-01 DIAGNOSIS — M9902 Segmental and somatic dysfunction of thoracic region: Secondary | ICD-10-CM | POA: Diagnosis not present

## 2019-09-01 DIAGNOSIS — M9906 Segmental and somatic dysfunction of lower extremity: Secondary | ICD-10-CM | POA: Diagnosis not present

## 2019-09-02 ENCOUNTER — Ambulatory Visit: Payer: BC Managed Care – PPO | Attending: Internal Medicine

## 2019-09-02 DIAGNOSIS — Z23 Encounter for immunization: Secondary | ICD-10-CM

## 2019-09-02 NOTE — Progress Notes (Signed)
   Covid-19 Vaccination Clinic  Name:  Wayne Miller    MRN: [id number] DOB: 1981-04-26  09/02/2019  [name] was observed post Covid-19 immunization for 15 minutes without incident. He was provided with Vaccine Information Sheet and instruction to access the V-Safe system.   [name] was instructed to call 911 with any severe reactions post vaccine: Marland Kitchen Difficulty breathing  . Swelling of face and throat  . A fast heartbeat  . A bad rash all over body  . Dizziness and weakness   Immunizations Administered    Name Date Dose VIS Date Route   Pfizer COVID-19 Vaccine 09/02/2019  8:14 AM 0.3 mL 05/19/2019 Intramuscular   Manufacturer: [place]   Lot: [id number]   [place]: [id number]

## 2019-09-05 DIAGNOSIS — M9902 Segmental and somatic dysfunction of thoracic region: Secondary | ICD-10-CM | POA: Diagnosis not present

## 2019-09-05 DIAGNOSIS — M9903 Segmental and somatic dysfunction of lumbar region: Secondary | ICD-10-CM | POA: Diagnosis not present

## 2019-09-05 DIAGNOSIS — M9906 Segmental and somatic dysfunction of lower extremity: Secondary | ICD-10-CM | POA: Diagnosis not present

## 2019-09-05 DIAGNOSIS — M9901 Segmental and somatic dysfunction of cervical region: Secondary | ICD-10-CM | POA: Diagnosis not present

## 2019-09-08 DIAGNOSIS — M9903 Segmental and somatic dysfunction of lumbar region: Secondary | ICD-10-CM | POA: Diagnosis not present

## 2019-09-08 DIAGNOSIS — M9906 Segmental and somatic dysfunction of lower extremity: Secondary | ICD-10-CM | POA: Diagnosis not present

## 2019-09-08 DIAGNOSIS — M9901 Segmental and somatic dysfunction of cervical region: Secondary | ICD-10-CM | POA: Diagnosis not present

## 2019-09-08 DIAGNOSIS — M9902 Segmental and somatic dysfunction of thoracic region: Secondary | ICD-10-CM | POA: Diagnosis not present

## 2019-09-12 ENCOUNTER — Other Ambulatory Visit: Payer: Self-pay

## 2019-09-19 DIAGNOSIS — M9902 Segmental and somatic dysfunction of thoracic region: Secondary | ICD-10-CM | POA: Diagnosis not present

## 2019-09-19 DIAGNOSIS — M9901 Segmental and somatic dysfunction of cervical region: Secondary | ICD-10-CM | POA: Diagnosis not present

## 2019-09-19 DIAGNOSIS — M9903 Segmental and somatic dysfunction of lumbar region: Secondary | ICD-10-CM | POA: Diagnosis not present

## 2019-09-19 DIAGNOSIS — M9906 Segmental and somatic dysfunction of lower extremity: Secondary | ICD-10-CM | POA: Diagnosis not present

## 2019-09-21 DIAGNOSIS — M9901 Segmental and somatic dysfunction of cervical region: Secondary | ICD-10-CM | POA: Diagnosis not present

## 2019-09-21 DIAGNOSIS — M9902 Segmental and somatic dysfunction of thoracic region: Secondary | ICD-10-CM | POA: Diagnosis not present

## 2019-09-21 DIAGNOSIS — M9903 Segmental and somatic dysfunction of lumbar region: Secondary | ICD-10-CM | POA: Diagnosis not present

## 2019-09-21 DIAGNOSIS — M9906 Segmental and somatic dysfunction of lower extremity: Secondary | ICD-10-CM | POA: Diagnosis not present

## 2019-09-22 ENCOUNTER — Ambulatory Visit: Payer: BC Managed Care – PPO | Admitting: Medical

## 2019-09-25 DIAGNOSIS — M9903 Segmental and somatic dysfunction of lumbar region: Secondary | ICD-10-CM | POA: Diagnosis not present

## 2019-09-25 DIAGNOSIS — M9902 Segmental and somatic dysfunction of thoracic region: Secondary | ICD-10-CM | POA: Diagnosis not present

## 2019-09-25 DIAGNOSIS — M9906 Segmental and somatic dysfunction of lower extremity: Secondary | ICD-10-CM | POA: Diagnosis not present

## 2019-09-25 DIAGNOSIS — M9901 Segmental and somatic dysfunction of cervical region: Secondary | ICD-10-CM | POA: Diagnosis not present

## 2019-09-27 ENCOUNTER — Ambulatory Visit: Payer: BC Managed Care – PPO | Attending: Internal Medicine

## 2019-09-27 DIAGNOSIS — Z23 Encounter for immunization: Secondary | ICD-10-CM

## 2019-09-27 NOTE — Progress Notes (Signed)
   Covid-19 Vaccination Clinic  Name:  TIYON ROORDA    MRN: [id number] DOB: 07/19/1980  09/27/2019  Mr. Glazener was observed post Covid-19 immunization for 15 minutes without incident. He was provided with Vaccine Information Sheet and instruction to access the V-Safe system.   Mr. Malzahn was instructed to call 911 with any severe reactions post vaccine: Marland Kitchen Difficulty breathing  . Swelling of face and throat  . A fast heartbeat  . A bad rash all over body  . Dizziness and weakness   Immunizations Administered    Name Date Dose VIS Date Route   Pfizer COVID-19 Vaccine 09/27/2019  4:02 PM 0.3 mL 08/02/2018 Intramuscular   Manufacturer: [place]   Lot: [id number]   [place]: [id number]

## 2019-09-28 DIAGNOSIS — M9903 Segmental and somatic dysfunction of lumbar region: Secondary | ICD-10-CM | POA: Diagnosis not present

## 2019-09-28 DIAGNOSIS — M9906 Segmental and somatic dysfunction of lower extremity: Secondary | ICD-10-CM | POA: Diagnosis not present

## 2019-09-28 DIAGNOSIS — M9902 Segmental and somatic dysfunction of thoracic region: Secondary | ICD-10-CM | POA: Diagnosis not present

## 2019-09-28 DIAGNOSIS — M9901 Segmental and somatic dysfunction of cervical region: Secondary | ICD-10-CM | POA: Diagnosis not present

## 2019-09-29 ENCOUNTER — Other Ambulatory Visit: Payer: Self-pay

## 2019-09-29 ENCOUNTER — Ambulatory Visit: Payer: BC Managed Care – PPO | Admitting: Medical

## 2019-09-29 VITALS — BP 124/81 | HR 99 | Resp 18 | Ht 67.0 in | Wt 229.0 lb

## 2019-09-29 DIAGNOSIS — M545 Low back pain, unspecified: Secondary | ICD-10-CM

## 2019-09-29 MED ORDER — PREDNISONE 10 MG (21) PO TBPK: ORAL_TABLET | ORAL | 0 refills | Status: DC

## 2019-09-29 NOTE — Patient Instructions (Signed)
For back pain on month recommend prednisone 6 day taper dose. Try back stretching exercise.  If pain persisting in one week consider referral to PT or sports med.  Don't recommend butran patch for you. I think you relative has chronic severe pain with potential other diagnosis.  Follow up 7-10 days or as needed   Ejercicios para la espalda Back Exercises Estos ejercicios ayudan a fortalecer el tronco y la espalda. adems, ayudan a mantener la flexibilidad de la zona lumbar. Hacer estos ejercicios puede ser de ayuda para evitar o Best boy de espalda.  Si tiene dolor de espalda, trate de Dispensing optician ejercicios 2 o 3veces por da, o como se lo haya indicado el mdico.  A medida que [place], haga los ejercicios una vez por da. Repita los ejercicios con ms frecuencia como se lo haya indicado el mdico.  Para evitar que el dolor regrese, haga los ejercicios una vez por da o como se lo haya indicado el mdico. Ejercicios Rodilla al pecho Repita estos pasos 3 o 5veces seguidas con cada pierna: 1. Acustese boca arriba sobre una cama dura o sobre el suelo con las piernas extendidas. 2. Lleve una rodilla al pecho. 3. Agarre la rodilla o el muslo con ambas manos y sostngalo en su lugar. 4. Tire de la rodilla hasta sentir una elongacin suave en la parte baja de la espalda o las nalgas. 5. Mantenga la elongacin durante 10 a 30segundos. 6. Suelte y extienda la pierna lentamente. Inclinacin de la pelvis Repita estos pasos 5 o 10veces seguidas: 1. Acustese boca arriba sobre una cama dura o sobre el suelo con las piernas extendidas. 2. Flexione las rodillas de manera que apunten al techo. Los pies deben estar apoyados en el suelo. 3. Contraiga los msculos de la parte baja del vientre (abdomen) para empujar la zona lumbar contra el suelo. Este movimiento har que el coxis apunte hacia el techo, en lugar de apuntar hacia abajo en direccin a los pies o al suelo. 4. Mantenga esta  posicin durante 5 a 10segundos mientras contrae suavemente los msculos y respira con normalidad. El perro y el gato Repita estos pasos hasta que la zona lumbar se curve con ms facilidad: 1. [place] manos y las rodillas sobre una superficie firme. Las manos deben estar alineadas con los hombros y las rodillas con las caderas. Puede colocarse almohadillas debajo de las rodillas. 2. Deje que la cabeza cuelgue hacia el pecho. Tense (contraiga) los msculos del vientre. Baje el coxis en direccin al suelo de modo que la zona lumbar se arquee como el lomo de un [place]. 3. Mantenga esta posicin durante 5segundos. 4. [place] lentamente la cabeza. Relaje los msculos del vientre. Eleve el coxis de modo que apunte en direccin al techo para que la espalda forme un arco hundido como el lomo de un perro contento. 5. Mantenga esta posicin durante 5segundos.  Flexiones de brazos Repita estos pasos 5 o 10veces seguidas: 1. Acustese boca abajo en el suelo. [place] manos cerca de la cabeza, separadas aproximadamente al ancho de los hombros. 3. Con la espalda relajada y las caderas apoyadas en el suelo, extienda lentamente los brazos para levantar la mitad superior del cuerpo y Community education officer los hombros. No use los msculos de la espalda. Puede cambiar la ubicacin de las manos para estar ms cmodo. 4. Mantenga esta posicin durante 5segundos. 5. Lentamente vuelva a la posicin horizontal.  Puentes Repita estos pasos 10veces seguidas: 1. Acustese  boca arriba sobre una superficie firme. 2. Flexione las rodillas de manera que apunten al techo. Los pies deben estar apoyados en el suelo. Los brazos deben estar paralelos a los costados del cuerpo, cerca del cuerpo. 3. Contraiga los glteos y despegue las nalgas del suelo hasta que la cintura est casi a la altura de las rodillas. Si no siente el trabajo muscular en las nalgas y la parte posterior de los muslos, aleje los pies 1 o  2pulgadas (2.5 o 5centmetros) de las nalgas. 4. Mantenga esta posicin durante 3 a 5segundos. 5. Lentamente, vuelva a apoyar las nalgas en el suelo y relaje los glteos. Si este ejercicio le resulta muy fcil, intente realizarlo con los brazos cruzados [place]. Abdominales Repita estos pasos 5 o 10veces seguidas: 1. Acustese boca arriba sobre una cama dura o sobre el suelo con las piernas extendidas. 2. Flexione las rodillas de manera que apunten al techo. Los pies deben estar apoyados en el suelo. 3. Cruce los [employer]. 4. Baje levemente el mentn en direccin al pecho, pero no doble el cuello. 5. Contraiga los msculos del abdomen y con lentitud eleve el pecho lo suficiente como para despegar levemente los omplatos del suelo. Evite levantar el cuerpo ms alto que eso, porque puede sobreexigir la zona lumbar. 6. Lentamente baje el pecho y la cabeza hasta el suelo. Elevaciones de espalda Repita estos pasos 5 o 10veces seguidas: 1. Acustese boca abajo con los brazos a los costados y apoye la frente en el suelo. 2. Contraiga los msculos de las piernas y los glteos. 3. Lentamente despegue el pecho del suelo mientras mantiene las caderas apoyadas en el suelo. Mantenga la nuca alineada con la curvatura de la espalda. Mire hacia el suelo mientras hace este ejercicio. 4. Mantenga esta posicin durante 3 a 5segundos. 5. Lentamente baje el pecho y el rostro hasta el suelo. Comunquese con un mdico si:  El dolor de espalda se vuelve mucho ms intenso cuando hace un ejercicio.  El dolor de espalda no mejora 2horas despus de [employer] ejercicios. Si tiene alguno de [employer], deje de [employer] ejercicios. No vuelva a hacer los ejercicios a menos que el mdico lo autorice. Solicite ayuda inmediatamente si:  Siente un dolor sbito y muy intenso en la espalda. Si esto ocurre, deje de [employer]. No vuelva a hacer los ejercicios a menos que el mdico lo  autorice. Esta informacin no tiene Marine scientist el consejo del mdico. Asegrese de hacerle al mdico cualquier pregunta que tenga. Document Revised: 03/24/2018 Document Reviewed: 03/24/2018 Elsevier Patient Education  [place].

## 2019-09-29 NOTE — Progress Notes (Signed)
Subjective:    Patient ID: Wayne Miller, male    DOB: 01/06/1981, 39 y.o.   MRN: [id number]  HPI  Pt in for follow up.     Pt states his has back pain for 1 month and I saw him for pain September as well. His pain got better but recent reoccured  Pt has back pain for one month. Was moving things around house. Pt has used tylenol in the past. Pt allergic to ibuprofen. Pt states has not used prednisone in the past. No radicular pain and no red flag signs or symptoms. Some pain on movement.  Pt is curious about butrans patch as his brother in law uses. I explained to pt not good option for him.   Xray of lower back in august showed.  IMPRESSION: No acute abnormalities.      Review of Systems  Constitutional: Negative for chills, fatigue and fever.  Respiratory: Negative for cough, chest tightness, shortness of breath and wheezing.   Cardiovascular: Negative for chest pain and palpitations.  Gastrointestinal: Negative for abdominal pain.  Musculoskeletal: Positive for back pain. Negative for neck pain.  Skin: Negative for rash.  Neurological: Negative for dizziness, seizures, syncope and weakness.  Hematological: Negative for adenopathy. Does not bruise/bleed easily.  Psychiatric/Behavioral: Negative for behavioral problems, decreased concentration and dysphoric mood. The patient is not nervous/anxious.     Past Medical History:  Diagnosis Date  . Blood in stool   . GERD (gastroesophageal reflux disease)   . Herniated nucleus pulposis of lumbosacral region   . Multiple gastric ulcers      Social History   Socioeconomic History  . Marital status: Married    Spouse name: Not on file  . Number of children: Not on file  . Years of education: Not on file  . Highest education level: Not on file  Occupational History  . Not on file  Tobacco Use  . Smoking status: Former Smoker    Types: Cigarettes    Quit date: 08/29/2005    Years since quitting: 14.0  .  Smokeless tobacco: Never Used  Substance and Sexual Activity  . Alcohol use: Not Currently    Comment: past -not now  . Drug use: No  . Sexual activity: Yes  Other Topics Concern  . Not on file  Social History Narrative   ** Merged History Encounter **       Social Determinants of Health   Financial Resource Strain:   . Difficulty of Paying Living Expenses:   Food Insecurity:   . Worried About Charity fundraiser in the Last Year:   . Arboriculturist in the Last Year:   Transportation Needs:   . Film/video editor (Medical):   Marland Kitchen Lack of Transportation (Non-Medical):   Physical Activity:   . Days of Exercise per Week:   . Minutes of Exercise per Session:   Stress:   . Feeling of Stress :   Social Connections:   . Frequency of Communication with Friends and Family:   . Frequency of Social Gatherings with Friends and Family:   . Attends Religious Services:   . Active Member of Clubs or Organizations:   . Attends Archivist Meetings:   Marland Kitchen Marital Status:   Intimate Partner Violence:   . Fear of Current or Ex-Partner:   . Emotionally Abused:   Marland Kitchen Physically Abused:   . Sexually Abused:     Past Surgical History:  Procedure Laterality Date  .  KNEE ARTHROSCOPY Left    left knee scope "tear repair"  . LUMBAR LAMINECTOMY/DECOMPRESSION MICRODISCECTOMY Right 09/11/2015   Procedure: MICRO LUMBAR DECOMPRESSION OF RIGHT L4-5 AND L5-S1 AND RIGHT FORAMINOTOMY L5 (2 LEVELS);  Surgeon: Susa Day, MD;  Location: [facility];  Service: Orthopedics;  Laterality: Right;    Family History  Problem Relation Age of Onset  . Arthritis Mother   . Diabetes Mother   . Hyperlipidemia Mother   . Hypertension Mother   . Arthritis Father   . Depression Father   . Diabetes Father   . Hypertension Father   . Kidney disease Father   . Arthritis Maternal Grandmother   . Hypertension Maternal Grandmother   . Alcohol abuse Maternal Grandfather   . Prostate cancer Maternal Grandfather    . Arthritis Paternal Grandmother   . Birth defects Brother     Allergies  Allergen Reactions  . Ibuprofen Other (See Comments)    G I upset    Current Outpatient Medications on File Prior to Visit  Medication Sig Dispense Refill  . eszopiclone (LUNESTA) 2 MG TABS tablet Take 1 tablet (2 mg total) by mouth at bedtime as needed for sleep. Take immediately before bedtime 30 tablet 1  . mirtazapine (REMERON) 15 MG tablet Take 1 tablet (15 mg total) by mouth at bedtime. 30 tablet 0  . sertraline (ZOLOFT) 25 MG tablet Take 1 tablet (25 mg total) by mouth daily. 30 tablet 3   No current facility-administered medications on file prior to visit.    BP 124/81 (BP Location: Left Arm, Patient Position: Sitting, Cuff Size: Large)   Pulse 99   Resp 18   Ht 5\' 7"  (1.702 m)   Wt 229 lb (103.9 kg)   SpO2 (!) 64%   BMI 35.87 kg/m       Objective:   Physical Exam  General Appearance- Not in acute distress.    Chest and Lung Exam Auscultation: Breath sounds:-Normal. Clear even and unlabored. Adventitious sounds:- No Adventitious sounds.  Cardiovascular Auscultation:Rythm - Regular, rate and rythm. Heart Sounds -Normal heart sounds.  Abdomen Inspection:-Inspection Normal.  Palpation/Perucssion: Palpation and Percussion of the abdomen reveal- Non Tender, No Rebound tenderness, No rigidity(Guarding) and No Palpable abdominal masses.  Liver:-Normal.  Spleen:- Normal.   Back Mid lumbar spine tenderness to palpation. Pain on lateral movements and flexion/extension of the spine.  Lower ext neurologic  L5-S1 sensation intact bilaterally. Normal patellar reflexes bilaterally. No foot drop bilaterally.      Assessment & Plan:  For back pain on month recommend prednisone 6 day taper dose. Try back stretching exercise.  If pain persisting in one week consider referral to PT or sports med.  Don't recommend butran patch for you. I think you relative has chronic severe pain with  potential other diagnosis.  Follow up 7-10 days or as needed  Time spent with patient today was 20  minutes which consisted of chart review, discussing diagnosis, work up ,treatment and documentation.

## 2019-10-03 DIAGNOSIS — M9901 Segmental and somatic dysfunction of cervical region: Secondary | ICD-10-CM | POA: Diagnosis not present

## 2019-10-03 DIAGNOSIS — M9903 Segmental and somatic dysfunction of lumbar region: Secondary | ICD-10-CM | POA: Diagnosis not present

## 2019-10-03 DIAGNOSIS — M9902 Segmental and somatic dysfunction of thoracic region: Secondary | ICD-10-CM | POA: Diagnosis not present

## 2019-10-03 DIAGNOSIS — M9906 Segmental and somatic dysfunction of lower extremity: Secondary | ICD-10-CM | POA: Diagnosis not present

## 2019-10-11 ENCOUNTER — Other Ambulatory Visit: Payer: Self-pay

## 2019-10-11 ENCOUNTER — Ambulatory Visit: Payer: BC Managed Care – PPO | Admitting: Medical

## 2019-10-11 VITALS — BP 129/84 | HR 66 | Temp 97.2°F | Resp 18 | Ht 67.0 in | Wt 227.6 lb

## 2019-10-11 DIAGNOSIS — G4700 Insomnia, unspecified: Secondary | ICD-10-CM | POA: Diagnosis not present

## 2019-10-11 DIAGNOSIS — M545 Low back pain, unspecified: Secondary | ICD-10-CM

## 2019-10-11 DIAGNOSIS — F419 Anxiety disorder, unspecified: Secondary | ICD-10-CM

## 2019-10-11 MED ORDER — HYDROXYZINE HCL 25 MG PO TABS: ORAL_TABLET | ORAL | 0 refills | Status: DC

## 2019-10-11 NOTE — Patient Instructions (Signed)
For your low back pain, will go ahead and refer you to sports medicine since back pain still persisting to some degree.  For insomnia that is possibly related to anxiety. Will try hydroxyzine for sleep and anxiety.  Refer to psychiatrist. Let us know who you called so we can follow up.  Follow up 7 days or as needed

## 2019-10-11 NOTE — Progress Notes (Signed)
   Subjective:    Patient ID: Wayne Miller, male    DOB: 25-Apr-1981, 39 y.o.   MRN: [id number]  HPI  Pt in for follow up.  Pt states with prednisone his pain decreased about 50%. But still present.    Pt states pain level about 5/10.  Pt has used ibuprofen in past and hurt stomach. No hx of ulcers. He thinks took on empty stomach.  Pt xray came back normal.  Pt has hx of insomnia. Specialist thought he needed sleep study. Pt does not think he has sleep apnea. Wife states he does not snore. He went to neurologist who thought he had anxiety related sleep disorder, but I am confounded by the onset 1-2 weeks post surgery. The patient and his wife do not remember any comments about difficulties with intubation or extubation, hypoxemia, etc. He is a mild snorer. Has good sleep hygiene. He wants to see psychiatrist.   Review of Systems     Objective:   Physical Exam  General- No acute distress. Pleasant patient. Neck- Full range of motion, no jvd Lungs- Clear, even and unlabored. Heart- regular rate and rhythm. Neurologic- CNII- XII grossly intact.       Assessment & Plan:  For your low back pain, will go ahead and refer you to sports medicine since back pain still persisting to some degree.  For insomnia that is possibly related to anxiety. Will try hydroxyzine for sleep and anxiety.  Refer to psychiatrist. Let us know who you called so we can follow up.  Follow up 7 days or as needed  Time spent with patient today was  25 minutes which consisted of chart review, discussing diagnosis,  Treatment, referral and documentation.

## 2019-10-16 ENCOUNTER — Emergency Department (HOSPITAL_COMMUNITY): Payer: BC Managed Care – PPO

## 2019-10-16 ENCOUNTER — Emergency Department (HOSPITAL_COMMUNITY)
Admission: EM | Admit: 2019-10-16 | Discharge: 2019-10-16 | Disposition: A | Discharge status: Home / Self Care | Payer: BC Managed Care – PPO | Attending: Emergency Medicine | Admitting: Emergency Medicine

## 2019-10-16 ENCOUNTER — Encounter (HOSPITAL_COMMUNITY): Payer: Self-pay | Admitting: Emergency Medicine

## 2019-10-16 ENCOUNTER — Other Ambulatory Visit: Payer: Self-pay

## 2019-10-16 DIAGNOSIS — Y9389 Activity, other specified: Secondary | ICD-10-CM | POA: Diagnosis not present

## 2019-10-16 DIAGNOSIS — Y92009 Unspecified place in unspecified non-institutional (private) residence as the place of occurrence of the external cause: Secondary | ICD-10-CM | POA: Diagnosis not present

## 2019-10-16 DIAGNOSIS — Y999 Unspecified external cause status: Secondary | ICD-10-CM | POA: Insufficient documentation

## 2019-10-16 DIAGNOSIS — X500XXA Overexertion from strenuous movement or load, initial encounter: Secondary | ICD-10-CM | POA: Insufficient documentation

## 2019-10-16 DIAGNOSIS — M545 Low back pain: Secondary | ICD-10-CM | POA: Insufficient documentation

## 2019-10-16 DIAGNOSIS — G8929 Other chronic pain: Secondary | ICD-10-CM | POA: Diagnosis not present

## 2019-10-16 DIAGNOSIS — Z87891 Personal history of nicotine dependence: Secondary | ICD-10-CM | POA: Diagnosis not present

## 2019-10-16 DIAGNOSIS — R202 Paresthesia of skin: Secondary | ICD-10-CM | POA: Insufficient documentation

## 2019-10-16 MED ORDER — HYDROCODONE-ACETAMINOPHEN 5-325 MG PO TABS: 1.0000 | ORAL_TABLET | Freq: Four times a day (QID) | ORAL | 0 refills | Status: AC | PRN

## 2019-10-16 MED ORDER — ACETAMINOPHEN 500 MG PO TABS
1000.0000 mg | ORAL_TABLET | Freq: Once | ORAL | Status: AC
  Administered 2019-10-16: 1000 mg via ORAL
  Filled 2019-10-16: qty 2

## 2019-10-16 NOTE — ED Triage Notes (Signed)
Pt c/o 10/10 lower back pain after he was moving yesterday.

## 2019-10-16 NOTE — ED Provider Notes (Signed)
[facility] EMERGENCY DEPARTMENT Provider Note   CSN: [id number] Arrival date & time: 10/16/19  [id number]     History Chief Complaint  Patient presents with  . Back Pain    GIANLUCA SOTERO is a 39 y.o. male.  HPI HPI Comments: CORRIE TOMINAGA is a 39 y.o. male who presents to the Emergency Department complaining of acute on chronic back pain.  Patient reports a history of chronic midline lumbar pain.  Yesterday he was moving heavy boxes at his house and his pain worsened suddenly.  He states his pain is 10/10 with any movement or ambulation.  He reports associated tingling in the lateral thighs.  This occurs when his pain worsens.  He has been taking ibuprofen without any significant relief.  Per records, he was seen by his primary care provider on April 23 and May 5 for his midline low back pain.  He was given a sports medicine referral who he was "planning to follow-up with today".  He was placed on prednisone on April 23 and denies any relief with this.  He has a history of microlumbar decompression.  This was performed by Dr. Tonita Cong.  Patient has never followed up with him for his chronic back pain since the operation.  He denies any numbness, saddle anesthesia, bowel or bladder incontinence, chest pain, shortness of breath.     Past Medical History:  Diagnosis Date  . Blood in stool   . GERD (gastroesophageal reflux disease)   . Herniated nucleus pulposis of lumbosacral region   . Multiple gastric ulcers     Patient Active Problem List   Diagnosis Date Noted  . Anxiety about health 08/11/2018  . 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 Vitamin D deficiency 05/13/2018  . Spinal stenosis of lumbar region 09/11/2015    Past Surgical History:  Procedure Laterality Date  . KNEE ARTHROSCOPY Left    left knee scope "tear repair"  . LUMBAR LAMINECTOMY/DECOMPRESSION MICRODISCECTOMY Right 09/11/2015   Procedure: MICRO LUMBAR DECOMPRESSION OF RIGHT L4-5 AND L5-S1 AND RIGHT  FORAMINOTOMY L5 (2 LEVELS);  Surgeon: Susa Day, MD;  Location: [facility];  Service: Orthopedics;  Laterality: Right;       Family History  Problem Relation Age of Onset  . Arthritis Mother   . Diabetes Mother   . Hyperlipidemia Mother   . Hypertension Mother   . Arthritis Father   . Depression Father   . Diabetes Father   . Hypertension Father   . Kidney disease Father   . Arthritis Maternal Grandmother   . Hypertension Maternal Grandmother   . Alcohol abuse Maternal Grandfather   . Prostate cancer Maternal Grandfather   . Arthritis Paternal Grandmother   . Birth defects Brother     Social History   Tobacco Use  . Smoking status: Former Smoker    Types: Cigarettes    Quit date: 08/29/2005    Years since quitting: 14.1  . Smokeless tobacco: Never Used  Substance Use Topics  . Alcohol use: Not Currently    Comment: past -not now  . Drug use: No    Home Medications Prior to Admission medications   Medication Sig Start Date End Date Taking? Authorizing Provider  hydrOXYzine (ATARAX/VISTARIL) 25 MG tablet 1 tab po q hs insomnia or anxiety 10/11/19   Saguier, Percell Miller, PA-C  predniSONE (STERAPRED UNI-PAK 21 TAB) 10 MG (21) TBPK tablet Taper over 6 days. 09/29/19   Saguier, Percell Miller, PA-C    Allergies    Ibuprofen  Review of Systems   Review of Systems  Musculoskeletal: Positive for back pain and myalgias.  Skin: Negative for color change and wound.  Neurological: Positive for numbness (Tingling). Negative for weakness.   Physical Exam Updated Vital Signs BP 140/80   Pulse 79   Temp 98.4 F (36.9 C)   Resp 16   Ht 5\' 7"  (1.702 m)   Wt 103.2 kg   SpO2 98%   BMI 35.63 kg/m   Physical Exam Vitals and nursing note reviewed.  Constitutional:      General: He is in acute distress.     Appearance: Normal appearance. He is obese. He is not ill-appearing, toxic-appearing or diaphoretic.  HENT:     Head: Normocephalic and atraumatic.     Right Ear: External ear  normal.     Left Ear: External ear normal.     Nose: Nose normal.     Mouth/Throat:     Mouth: Mucous membranes are moist.  Eyes:     Extraocular Movements: Extraocular movements intact.  Cardiovascular:     Rate and Rhythm: Normal rate and regular rhythm.     Pulses: Normal pulses.     Comments: Palpable pedal pulses Pulmonary:     Effort: Pulmonary effort is normal.  Abdominal:     General: Abdomen is flat.     Palpations: Abdomen is soft.     Tenderness: There is no abdominal tenderness.  Musculoskeletal:        General: Tenderness present. No swelling or deformity.     Cervical back: Normal range of motion and neck supple. No tenderness.     Right lower leg: No edema.     Left lower leg: No edema.     Comments: Moderate midline lumbar back pain.  No palpable pain along the lumbar musculature bilaterally.  No pain with palpation in the midline cervical or thoracic spine.  Skin:    General: Skin is warm and dry.     Capillary Refill: Capillary refill takes less than 2 seconds.  Neurological:     General: No focal deficit present.     Mental Status: He is alert and oriented to person, place, and time.     Comments: Distal sensation intact in the bilateral lower extremities.  Unable to assess strength in lower extremities secondary to pain.  Patient has full range of motion of the ankles and feet.  Patient is able to move the feet and toes spontaneously and without difficulty.  Unable to assess gait secondary to pain.  2+ patella and Achilles DTRs noted bilaterally.  Psychiatric:        Mood and Affect: Mood normal.        Behavior: Behavior normal.   ED Results / Procedures / Treatments   Labs (all labs ordered are listed, but only abnormal results are displayed) Labs Reviewed - No data to display  EKG None  Radiology DG Lumbar Spine Complete  Result Date: 10/16/2019 CLINICAL DATA:  Acute on chronic midline back pain EXAM: LUMBAR SPINE - COMPLETE 4+ VIEW COMPARISON:   01/27/2019 FINDINGS: Degenerative facet disease at L5-S1. Disc spaces maintained. Normal alignment. No fracture. 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 No acute bony abnormality. Electronically Signed   By: Rolm Baptise M.D.   On: 10/16/2019 10:25   Procedures Procedures   Medications Ordered in ED Medications  acetaminophen (TYLENOL) tablet 1,000 mg (1,000 mg Oral Given 10/16/19 0950)    ED Course  I have  reviewed the triage vital signs and the nursing notes.  Pertinent labs & imaging results that were available during my care of the patient were reviewed by me and considered in my medical decision making (see chart for details).    MDM Rules/Calculators/A&P                      Patient is a 39 year old male that presents with acute on chronic midline low back pain.  He was doing heavy lifting yesterday and began experiencing worsening pain in the region.  Physical exam is significant for midline low back pain.  I had an x-ray performed showing mild degenerative facet disease at L5-S1.  The disc spaces are normal.  There is normal alignment.  There is no fracture.  His distal sensation is intact.  He is neurovascularly intact in the bilateral lower extremities.  Good patellar and Achilles reflexes.  I cannot get patient to ambulate or perform strength test due to his pain.  I gave the patient a gram of Tylenol in the emergency department.  This has mildly alleviated his pain.  We discussed the findings on his x-ray.  A Spanish interpreter was used.  Since he has been evaluated by Dr. Tonita Cong in the past, I will provide follow-up with Dr. Tonita Cong.  I also recommended that he reach out to the sports medicine doctor that he was already referred to.  Will give patient a short course of Norco for the next few days.  He understands not to mix this with alcohol or operate a vehicle after taking this.  He understands it can be constipating and we discussed staying adequately  hydrated.  He was given strict return precautions.  We discussed the symptoms of cauda equina and he knows to return to the emergency department if any of these develop.  I recommended he additionally discuss this visit with his primary care provider.  His questions were answered and he was amicable the time of discharge.  His vital signs are stable.  Patient discharged to home/self care.  Condition at discharge: Stable  Note: Portions of this report may have been transcribed using voice recognition software. Every effort was made to ensure accuracy; however, inadvertent computerized transcription errors may be present.    Final Clinical Impression(s) / ED Diagnoses Final diagnoses:  Chronic midline low back pain without sciatica    Rx / DC Orders ED Discharge Orders         Ordered    HYDROcodone-acetaminophen (NORCO/VICODIN) 5-325 MG tablet  Every 6 hours PRN     10/16/19 1102           Rayna Sexton, PA-C 10/16/19 1108    Sherwood Gambler, MD 10/16/19 1133

## 2019-10-16 NOTE — Discharge Instructions (Addendum)
Per our discussion, I am prescribing you a strong narcotic called Vicodin.  I would recommend only taking this as needed for management of breakthrough pain.  Please do not take more than 2 doses in a day.  This medication can have a constipating effect, so please make sure to stay adequately hydrated and eat a large amount of fiber.  You might want to consider taking a stool softener.  This medication also has Tylenol in it.  So if you are going to take Tylenol please do not exceed 4 g in a day.  If your symptoms worsen please do not hesitate to return to the emergency department.  It was a pleasure to meet you.

## 2019-10-17 DIAGNOSIS — M545 Low back pain: Secondary | ICD-10-CM | POA: Diagnosis not present

## 2019-10-24 DIAGNOSIS — S3992XD Unspecified injury of lower back, subsequent encounter: Secondary | ICD-10-CM | POA: Diagnosis not present

## 2019-10-24 DIAGNOSIS — M545 Low back pain: Secondary | ICD-10-CM | POA: Diagnosis not present

## 2019-10-24 DIAGNOSIS — M5136 Other intervertebral disc degeneration, lumbar region: Secondary | ICD-10-CM | POA: Diagnosis not present

## 2019-11-01 ENCOUNTER — Telehealth: Payer: Self-pay | Admitting: Medical

## 2019-11-01 NOTE — Telephone Encounter (Signed)
2nd attempt  Referral for psychiatry from 10/11/19 LM for pt to call with name of provider and if we need to send any documentation (note: I did not note it was for psychiatry referral as that is confidential and could not leave on voicemail).

## 2019-11-14 ENCOUNTER — Other Ambulatory Visit: Payer: Self-pay | Admitting: Medical

## 2019-11-21 DIAGNOSIS — M5416 Radiculopathy, lumbar region: Secondary | ICD-10-CM | POA: Diagnosis not present

## 2019-11-21 DIAGNOSIS — M545 Low back pain: Secondary | ICD-10-CM | POA: Diagnosis not present

## 2019-11-21 DIAGNOSIS — M5136 Other intervertebral disc degeneration, lumbar region: Secondary | ICD-10-CM | POA: Diagnosis not present

## 2019-12-04 DIAGNOSIS — M545 Low back pain: Secondary | ICD-10-CM | POA: Diagnosis not present

## 2019-12-15 DIAGNOSIS — S3992XD Unspecified injury of lower back, subsequent encounter: Secondary | ICD-10-CM | POA: Diagnosis not present

## 2019-12-15 DIAGNOSIS — M545 Low back pain: Secondary | ICD-10-CM | POA: Diagnosis not present

## 2019-12-15 DIAGNOSIS — M5416 Radiculopathy, lumbar region: Secondary | ICD-10-CM | POA: Diagnosis not present

## 2019-12-15 DIAGNOSIS — M5136 Other intervertebral disc degeneration, lumbar region: Secondary | ICD-10-CM | POA: Diagnosis not present

## 2019-12-17 ENCOUNTER — Other Ambulatory Visit: Payer: Self-pay | Admitting: Medical

## 2019-12-28 DIAGNOSIS — M47816 Spondylosis without myelopathy or radiculopathy, lumbar region: Secondary | ICD-10-CM | POA: Diagnosis not present

## 2020-01-17 ENCOUNTER — Other Ambulatory Visit: Payer: Self-pay | Admitting: Medical

## 2020-02-19 ENCOUNTER — Other Ambulatory Visit: Payer: Self-pay | Admitting: Medical

## 2020-03-22 ENCOUNTER — Other Ambulatory Visit: Payer: Self-pay | Admitting: Medical

## 2020-04-21 ENCOUNTER — Other Ambulatory Visit: Payer: Self-pay | Admitting: Medical

## 2020-05-20 ENCOUNTER — Other Ambulatory Visit: Payer: Self-pay | Admitting: Medical

## 2020-05-22 ENCOUNTER — Other Ambulatory Visit: Payer: Self-pay | Admitting: Medical

## 2020-06-21 ENCOUNTER — Other Ambulatory Visit (HOSPITAL_BASED_OUTPATIENT_CLINIC_OR_DEPARTMENT_OTHER): Payer: Self-pay | Admitting: Internal Medicine

## 2020-06-21 ENCOUNTER — Ambulatory Visit: Payer: Self-pay | Attending: Internal Medicine

## 2020-06-21 DIAGNOSIS — Z23 Encounter for immunization: Secondary | ICD-10-CM

## 2020-06-21 NOTE — Progress Notes (Signed)
   Covid-19 Vaccination Clinic  Name:  Wayne Miller    MRN: [id number] DOB: 05-17-1981  06/21/2020  Wayne Miller was observed post Covid-19 immunization for 15 minutes without incident. He was provided with Vaccine Information Sheet and instruction to access the V-Safe system.   Wayne Miller was instructed to call 911 with any severe reactions post vaccine: Marland Kitchen Difficulty breathing  . Swelling of face and throat  . A fast heartbeat  . A bad rash all over body  . Dizziness and weakness   Immunizations Administered    Name Date Dose VIS Date Route   Pfizer COVID-19 Vaccine 06/21/2020  9:19 AM 0.3 mL 03/27/2020 Intramuscular   Manufacturer: [employer]   Lot: [id number]   NDC: [address]-7

## 2020-06-25 MED FILL — PFIZER-BIONTECH COVID-19 VA: 30 | 21 days supply | Qty: 0 | Fill #0

## 2020-07-23 DIAGNOSIS — F5101 Primary insomnia: Secondary | ICD-10-CM | POA: Diagnosis not present

## 2020-08-19 DIAGNOSIS — F5101 Primary insomnia: Secondary | ICD-10-CM | POA: Diagnosis not present

## 2020-09-16 DIAGNOSIS — F5101 Primary insomnia: Secondary | ICD-10-CM | POA: Diagnosis not present

## 2020-10-01 ENCOUNTER — Other Ambulatory Visit: Payer: Self-pay

## 2020-10-01 ENCOUNTER — Ambulatory Visit (INDEPENDENT_AMBULATORY_CARE_PROVIDER_SITE_OTHER): Payer: BC Managed Care – PPO | Admitting: Medical

## 2020-10-01 ENCOUNTER — Encounter: Payer: Self-pay | Admitting: Medical

## 2020-10-01 VITALS — BP 122/82 | HR 63 | Resp 18 | Ht 67.0 in | Wt 237.8 lb

## 2020-10-01 DIAGNOSIS — Z0000 Encounter for general adult medical examination without abnormal findings: Secondary | ICD-10-CM

## 2020-10-01 DIAGNOSIS — E559 Vitamin D deficiency, unspecified: Secondary | ICD-10-CM | POA: Diagnosis not present

## 2020-10-01 LAB — CBC WITH DIFFERENTIAL/PLATELET
Basophils Absolute: 0 10*3/uL (ref 0.0–0.1)
Basophils Relative: 0.5 % (ref 0.0–3.0)
Eosinophils Absolute: 0.3 10*3/uL (ref 0.0–0.7)
Eosinophils Relative: 5 % (ref 0.0–5.0)
HCT: 44.9 % (ref 39.0–52.0)
Hemoglobin: 15.1 g/dL (ref 13.0–17.0)
Lymphocytes Relative: 33.1 % (ref 12.0–46.0)
Lymphs Abs: 2.1 10*3/uL (ref 0.7–4.0)
MCHC: 33.6 g/dL (ref 30.0–36.0)
MCV: 89.8 fl (ref 78.0–100.0)
Monocytes Absolute: 0.5 10*3/uL (ref 0.1–1.0)
Monocytes Relative: 8.1 % (ref 3.0–12.0)
Neutro Abs: 3.5 10*3/uL (ref 1.4–7.7)
Neutrophils Relative %: 53.3 % (ref 43.0–77.0)
Platelets: 239 10*3/uL (ref 150.0–400.0)
RBC: 5 Mil/uL (ref 4.22–5.81)
RDW: 14.2 % (ref 11.5–15.5)
WBC: 6.5 10*3/uL (ref 4.0–10.5)

## 2020-10-01 LAB — COMPREHENSIVE METABOLIC PANEL
ALT: 19 U/L (ref 0–53)
AST: 14 U/L (ref 0–37)
Albumin: 4.2 g/dL (ref 3.5–5.2)
Alkaline Phosphatase: 60 U/L (ref 39–117)
BUN: 15 mg/dL (ref 6–23)
CO2: 25 mEq/L (ref 19–32)
Calcium: 8.9 mg/dL (ref 8.4–10.5)
Chloride: 107 mEq/L (ref 96–112)
Creatinine, Ser: 0.96 mg/dL (ref 0.40–1.50)
GFR: 98.16 mL/min (ref >60.00)
Glucose, Bld: 98 mg/dL (ref 70–99)
Potassium: 4.2 mEq/L (ref 3.5–5.1)
Sodium: 138 mEq/L (ref 135–145)
Total Bilirubin: 0.5 mg/dL (ref 0.2–1.2)
Total Protein: 6.7 g/dL (ref 6.0–8.3)

## 2020-10-01 LAB — LIPID PANEL
Cholesterol: 147 mg/dL (ref 0–200)
HDL: 30.7 mg/dL — ABNORMAL LOW (ref >39.00)
NonHDL: 116.54
Total CHOL/HDL Ratio: 5
Triglycerides: 228 mg/dL — ABNORMAL HIGH (ref 0.0–149.0)
VLDL: 45.6 mg/dL — ABNORMAL HIGH (ref 0.0–40.0)

## 2020-10-01 LAB — LDL CHOLESTEROL, DIRECT: Direct LDL: 81 mg/dL

## 2020-10-01 NOTE — Progress Notes (Addendum)
Subjective:    Patient ID: Wayne Miller, male    DOB: August 01, 1980, 40 y.o.   MRN: [id number]  HPI  Pt in for cpe/wellness exam.  Pt is fasting. Pt states some exercise after work and a lot of walking at work. Pt states he is eating moderate healthy. No alcohol use and no smoking.  UP to date on vaccines.   Review of Systems  Constitutional: Negative for chills, fatigue and fever.  HENT: Negative for congestion, postnasal drip, rhinorrhea, sinus pressure and sinus pain.   Respiratory: Negative for cough, chest tightness, shortness of breath and wheezing.   Cardiovascular: Negative for chest pain and palpitations.  Gastrointestinal: Negative for abdominal pain.  Musculoskeletal: Negative for back pain and myalgias.  Skin: Negative for rash.  Neurological: Negative for dizziness, numbness and headaches.  Hematological: Negative for adenopathy. Does not bruise/bleed easily.  Psychiatric/Behavioral: Negative for behavioral problems, confusion and suicidal ideas. The patient is not nervous/anxious.    Past Medical History:  Diagnosis Date  . Blood in stool   . GERD (gastroesophageal reflux disease)   . Herniated nucleus pulposis of lumbosacral region   . Multiple gastric ulcers      Social History   Socioeconomic History  . Marital status: Married    Spouse name: Not on file  . Number of children: Not on file  . Years of education: Not on file  . Highest education level: Not on file  Occupational History  . Not on file  Tobacco Use  . Smoking status: Former Smoker    Types: Cigarettes    Quit date: 08/29/2005    Years since quitting: 15.1  . Smokeless tobacco: Never Used  Vaping Use  . Vaping Use: Never used  Substance and Sexual Activity  . Alcohol use: Not Currently    Comment: past -not now  . Drug use: No  . Sexual activity: Yes  Other Topics Concern  . Not on file  Social History Narrative   ** Merged History Encounter **       Social Determinants of  Health   Financial Resource Strain: Not on file  Food Insecurity: Not on file  Transportation Needs: Not on file  Physical Activity: Not on file  Stress: Not on file  Social Connections: Not on file  Intimate Partner Violence: Not on file    Past Surgical History:  Procedure Laterality Date  . KNEE ARTHROSCOPY Left    left knee scope "tear repair"  . LUMBAR LAMINECTOMY/DECOMPRESSION MICRODISCECTOMY Right 09/11/2015   Procedure: MICRO LUMBAR DECOMPRESSION OF RIGHT L4-5 AND L5-S1 AND RIGHT FORAMINOTOMY L5 (2 LEVELS);  Surgeon: Susa Day, MD;  Location: [facility];  Service: Orthopedics;  Laterality: Right;    Family History  Problem Relation Age of Onset  . Arthritis Mother   . Diabetes Mother   . Hyperlipidemia Mother   . Hypertension Mother   . Arthritis Father   . Depression Father   . Diabetes Father   . Hypertension Father   . Kidney disease Father   . Arthritis Maternal Grandmother   . Hypertension Maternal Grandmother   . Alcohol abuse Maternal Grandfather   . Prostate cancer Maternal Grandfather   . Arthritis Paternal Grandmother   . Birth defects Brother     Allergies  Allergen Reactions  . Ibuprofen Other (See Comments)    G I upset    Current Outpatient Medications on File Prior to Visit  Medication Sig Dispense Refill  . clonazePAM (KLONOPIN) 1 MG  tablet Take 0.5-1 mg by mouth at bedtime.    . hydrOXYzine (ATARAX/VISTARIL) 25 MG tablet TAKE 1 TABLET BY MOUTH AT BEDTIME FOR  INSOMNIA  OR  ANXIETY 30 tablet 3  . QUEtiapine (SEROQUEL) 50 MG tablet Take 50-100 mg by mouth at bedtime.     No current facility-administered medications on file prior to visit.    BP 122/82   Pulse 63   Resp 18   Ht 5\' 7"  (1.702 m)   Wt 237 lb 12.8 oz (107.9 kg)   SpO2 98%   BMI 37.24 kg/m       Objective:   Physical Exam  General Mental Status- Alert. General Appearance- Not in acute distress.   Skin General: Color- Normal Color. Moisture- Normal  Moisture.  Neck Carotid Arteries- Normal color. Moisture- Normal Moisture. No carotid bruits. No JVD.  Chest and Lung Exam Auscultation: Breath Sounds:-Normal.  Cardiovascular Auscultation:Rythm- Regular. Murmurs & Other Heart Sounds:Auscultation of the heart reveals- No Murmurs.  Abdomen Inspection:-Inspeection Normal. Palpation/Percussion:Note:No mass. Palpation and Percussion of the abdomen reveal- Non Tender, Non Distended + BS, no rebound or guarding.   Neurologic Cranial Nerve exam:- CN III-XII intact(No nystagmus), symmetric smile. Strength:- 5/5 equal and symmetric strength both upper and lower extremities.      Assessment & Plan:  For you wellness exam today I have ordered cbc, cmp and lipid panel.  Vaccine up to date.  Recommend exercise and healthy diet.  We will let you know lab results as they come in.  Follow up date appointment will be determined after lab review.   Mackie Pai, PA-C

## 2020-10-01 NOTE — Patient Instructions (Addendum)
For you wellness exam today I have ordered cbc, cmp and lipid panel.  Vaccine up to date.  Recommend exercise and healthy diet.  We will let you know lab results as they come in.  Follow up date appointment will be determined after lab review.    Preventive Care 30-42 Years Old, Male Preventive care refers to lifestyle choices and visits with your health care provider that can promote health and wellness. This includes:  A yearly physical exam. This is also called an annual wellness visit.  Regular dental and eye exams.  Immunizations.  Screening for certain conditions.  Healthy lifestyle choices, such as: ? Eating a healthy diet. ? Getting regular exercise. ? Not using drugs or products that contain nicotine and tobacco. ? Limiting alcohol use. What can I expect for my preventive care visit? Physical exam Your health care provider will check your:  Height and weight. These may be used to calculate your BMI (body mass index). BMI is a measurement that tells if you are at a healthy weight.  Heart rate and blood pressure.  Body temperature.  Skin for abnormal spots. Counseling Your health care provider may ask you questions about your:  Past medical problems.  Family's medical history.  Alcohol, tobacco, and drug use.  Emotional well-being.  Home life and relationship well-being.  Sexual activity.  Diet, exercise, and sleep habits.  Work and work Statistician.  Access to firearms. What immunizations do I need? Vaccines are usually given at various ages, according to a schedule. Your health care provider will recommend vaccines for you based on your age, medical history, and lifestyle or other factors, such as travel or where you work.   What tests do I need? Blood tests  Lipid and cholesterol levels. These may be checked every 5 years, or more often if you are over 40 years old.  Hepatitis C test.  Hepatitis B test. Screening  Lung cancer screening.  You may have this screening every year starting at age 9 if you have a 30-pack-year history of smoking and currently smoke or have quit within the past 15 years.  Prostate cancer screening. Recommendations will vary depending on your family history and other risks.  Genital exam to check for testicular cancer or hernias.  Colorectal cancer screening. ? All adults should have this screening starting at age 21 and continuing until age 24. ? Your health care provider may recommend screening at age 72 if you are at increased risk. ? You will have tests every 1-10 years, depending on your results and the type of screening test.  Diabetes screening. ? This is done by checking your blood sugar (glucose) after you have not eaten for a while (fasting). ? You may have this done every 1-3 years.  STD (sexually transmitted disease) testing, if you are at risk. Follow these instructions at home: Eating and drinking  Eat a diet that includes fresh fruits and vegetables, whole grains, lean protein, and low-fat dairy products.  Take vitamin and mineral supplements as recommended by your health care provider.  Do not drink alcohol if your health care provider tells you not to drink.  If you drink alcohol: ? Limit how much you have to 0-2 drinks a day. ? Be aware of how much alcohol is in your drink. In the [place] one drink equals one 12 oz bottle of beer (355 mL), one 5 oz glass of wine (148 mL), or one 1 oz glass of hard liquor (44 mL).  Lifestyle  Take daily care of your teeth and gums. Brush your teeth every morning and night with fluoride toothpaste. Floss one time each day.  Stay active. Exercise for at least 30 minutes 5 or more days each week.  Do not use any products that contain nicotine or tobacco, such as cigarettes, e-cigarettes, and chewing tobacco. If you need help quitting, ask your health care provider.  Do not use drugs.  If you are sexually active, practice safe sex. Use a  condom or other form of protection to prevent STIs (sexually transmitted infections).  If told by your health care provider, take low-dose aspirin daily starting at age 16.  Find healthy ways to cope with stress, such as: ? Meditation, yoga, or listening to music. ? Journaling. ? Talking to a trusted person. ? Spending time with friends and family. Safety  Always wear your seat belt while driving or riding in a vehicle.  Do not drive: ? If you have been drinking alcohol. Do not ride with someone who has been drinking. ? When you are tired or distracted. ? While texting.  Wear a helmet and other protective equipment during sports activities.  If you have firearms in your house, make sure you follow all gun safety procedures. What's next?  Go to your health care provider once a year for an annual wellness visit.  Ask your health care provider how often you should have your eyes and teeth checked.  Stay up to date on all vaccines. This information is not intended to replace advice given to you by your health care provider. Make sure you discuss any questions you have with your health care provider. Document Revised: 02/21/2019 Document Reviewed: 05/19/2018 Elsevier Patient Education  2021 [employer].

## 2020-11-08 DIAGNOSIS — Z20828 Contact with and (suspected) exposure to other viral communicable diseases: Secondary | ICD-10-CM | POA: Diagnosis not present

## 2020-11-11 DIAGNOSIS — F5101 Primary insomnia: Secondary | ICD-10-CM | POA: Diagnosis not present

## 2021-01-06 DIAGNOSIS — F5101 Primary insomnia: Secondary | ICD-10-CM | POA: Diagnosis not present

## 2021-03-03 DIAGNOSIS — F5101 Primary insomnia: Secondary | ICD-10-CM | POA: Diagnosis not present

## 2021-04-14 ENCOUNTER — Telehealth: Payer: Self-pay | Admitting: Medical

## 2021-04-14 NOTE — Telephone Encounter (Signed)
Spoke with patient and he stated he is having light red , on toilet paper when he wipes , no abd pain.Marland Kitchen onset: couple weeks    and advised to seek medical attention at [facility] or ED

## 2021-04-14 NOTE — Telephone Encounter (Signed)
Pt stated he wanted an appt for blood in his stool, scheduled him earliest available with Edward,which is not until next week. He was transferred to triage in case he needed to seek urgent care. Please advise.

## 2021-04-22 ENCOUNTER — Other Ambulatory Visit: Payer: Self-pay

## 2021-04-22 ENCOUNTER — Ambulatory Visit: Payer: BC Managed Care – PPO | Admitting: Medical

## 2021-04-22 VITALS — BP 135/92 | HR 72 | Temp 98.2°F | Resp 18 | Ht 67.0 in | Wt 240.0 lb

## 2021-04-22 DIAGNOSIS — K649 Unspecified hemorrhoids: Secondary | ICD-10-CM

## 2021-04-22 DIAGNOSIS — R1033 Periumbilical pain: Secondary | ICD-10-CM

## 2021-04-22 DIAGNOSIS — K219 Gastro-esophageal reflux disease without esophagitis: Secondary | ICD-10-CM | POA: Diagnosis not present

## 2021-04-22 LAB — COMPREHENSIVE METABOLIC PANEL
ALT: 20 U/L (ref 0–53)
AST: 16 U/L (ref 0–37)
Albumin: 4.3 g/dL (ref 3.5–5.2)
Alkaline Phosphatase: 70 U/L (ref 39–117)
BUN: 15 mg/dL (ref 6–23)
CO2: 27 mEq/L (ref 19–32)
Calcium: 8.8 mg/dL (ref 8.4–10.5)
Chloride: 109 mEq/L (ref 96–112)
Creatinine, Ser: 0.97 mg/dL (ref 0.40–1.50)
GFR: 96.57 mL/min (ref >60.00)
Glucose, Bld: 92 mg/dL (ref 70–99)
Potassium: 4.5 mEq/L (ref 3.5–5.1)
Sodium: 142 mEq/L (ref 135–145)
Total Bilirubin: 0.5 mg/dL (ref 0.2–1.2)
Total Protein: 6.6 g/dL (ref 6.0–8.3)

## 2021-04-22 LAB — CBC WITH DIFFERENTIAL/PLATELET
Basophils Absolute: 0 10*3/uL (ref 0.0–0.1)
Basophils Relative: 0.4 % (ref 0.0–3.0)
Eosinophils Absolute: 0.2 10*3/uL (ref 0.0–0.7)
Eosinophils Relative: 3.9 % (ref 0.0–5.0)
HCT: 44.7 % (ref 39.0–52.0)
Hemoglobin: 14.7 g/dL (ref 13.0–17.0)
Lymphocytes Relative: 32.4 % (ref 12.0–46.0)
Lymphs Abs: 2 10*3/uL (ref 0.7–4.0)
MCHC: 32.9 g/dL (ref 30.0–36.0)
MCV: 91.6 fl (ref 78.0–100.0)
Monocytes Absolute: 0.4 10*3/uL (ref 0.1–1.0)
Monocytes Relative: 6.7 % (ref 3.0–12.0)
Neutro Abs: 3.5 10*3/uL (ref 1.4–7.7)
Neutrophils Relative %: 56.6 % (ref 43.0–77.0)
Platelets: 222 10*3/uL (ref 150.0–400.0)
RBC: 4.89 Mil/uL (ref 4.22–5.81)
RDW: 14.1 % (ref 11.5–15.5)
WBC: 6.1 10*3/uL (ref 4.0–10.5)

## 2021-04-22 LAB — LIPASE: Lipase: 30 U/L (ref 11.0–59.0)

## 2021-04-22 MED ORDER — FAMOTIDINE 20 MG PO TABS: 20.0000 mg | ORAL_TABLET | Freq: Every day | ORAL | 3 refills | Status: DC

## 2021-04-22 MED ORDER — PRAMOXINE HCL (PERIANAL) 1 % EX FOAM: 1.0000 "application " | Freq: Three times a day (TID) | CUTANEOUS | 0 refills | Status: DC | PRN

## 2021-04-22 NOTE — Patient Instructions (Addendum)
Moderate to large hemorrhoids are present.  Prescribed Proctofoam to use twice daily.  Continue with shallow warm salt water bath twice daily as well.  Recommend increasing fiber in diet to reduce constipation.  Went ahead and placed referral to gastroenterologist.  You expressed concern about need for colonoscopy.  This might not be needed presently based on hemorrhoid presents and bright red blood.  Gastroenterologist will decide if that is needed.  Abdomen pain right of umbilicus.  We will get CBC, CMP, lipase and H. pylori breath test.  Prescribing famotidine and encouraging healthy diet as discussed.  Describing some GERD/reflux symptoms as well recently.  Follow-up in 4 to 6 weeks or sooner if needed.(Explained sooner appointment if symptoms worsen or change pending GI appointment) advise ED evaluation for severe signs symptoms change.

## 2021-04-22 NOTE — Progress Notes (Signed)
Subjective:    Patient ID: Wayne Miller, male    DOB: 04-08-1981, 40 y.o.   MRN: [id number]  HPI Pt in for some recent bright red blood on the toilet pape for one week. He states blood on paper. Not in water. Pt states he wants colonoscopy. Pt has no itching or pain in his rectal areas. He states 3 months ago he ha some hemorrhoid irritation.   Pt states some constipation. States had to strain last night. He usually has bm every 3 rd. He states   No family history of colon cancer or polyps. No known history of crohn's or ulcerative colitis.  Pt states some mild abdomen pain rt of umbilicus region. Come and goes over past 3 weeks.   Also for 6 months has reflux type symptoms after eating hot sauces/spicy foods.   Review of Systems  Constitutional:  Negative for chills, fatigue and fever.  HENT:  Negative for congestion.   Respiratory:  Negative for cough, chest tightness, shortness of breath and wheezing.   Cardiovascular:  Negative for chest pain and palpitations.  Gastrointestinal:  Positive for abdominal pain. Negative for abdominal distention, anal bleeding, blood in stool, constipation, diarrhea, nausea and vomiting.       Some constipation. Some reflux symptoms.  Genitourinary:  Negative for difficulty urinating, flank pain, frequency and hematuria.  Musculoskeletal:  Negative for back pain.   Past Medical History:  Diagnosis Date   Blood in stool    GERD (gastroesophageal reflux disease)    Herniated nucleus pulposis of lumbosacral region    Multiple gastric ulcers      Social History   Socioeconomic History   Marital status: Married    Spouse name: Not on file   Number of children: Not on file   Years of education: Not on file   Highest education level: Not on file  Occupational History   Not on file  Tobacco Use   Smoking status: Former    Types: Cigarettes    Quit date: 08/29/2005    Years since quitting: 15.6   Smokeless tobacco: Never  Vaping Use    Vaping Use: Never used  Substance and Sexual Activity   Alcohol use: Not Currently    Comment: past -not now   Drug use: No   Sexual activity: Yes  Other Topics Concern   Not on file  Social History Narrative   ** Merged History Encounter **       Social Determinants of Health   Financial Resource Strain: Not on file  Food Insecurity: Not on file  Transportation Needs: Not on file  Physical Activity: Not on file  Stress: Not on file  Social Connections: Not on file  Intimate Partner Violence: Not on file    Past Surgical History:  Procedure Laterality Date   KNEE ARTHROSCOPY Left    left knee scope "tear repair"   LUMBAR LAMINECTOMY/DECOMPRESSION MICRODISCECTOMY Right 09/11/2015   Procedure: MICRO LUMBAR DECOMPRESSION OF RIGHT L4-5 AND L5-S1 AND RIGHT FORAMINOTOMY L5 (2 LEVELS);  Surgeon: Susa Day, MD;  Location: [facility];  Service: Orthopedics;  Laterality: Right;    Family History  Problem Relation Age of Onset   Arthritis Mother    Diabetes Mother    Hyperlipidemia Mother    Hypertension Mother    Arthritis Father    Depression Father    Diabetes Father    Hypertension Father    Kidney disease Father    Arthritis Maternal Grandmother  Hypertension Maternal Grandmother    Alcohol abuse Maternal Grandfather    Prostate cancer Maternal Grandfather    Arthritis Paternal Grandmother    Birth defects Brother     Allergies  Allergen Reactions   Ibuprofen Other (See Comments)    G I upset    Current Outpatient Medications on File Prior to Visit  Medication Sig Dispense Refill   clonazePAM (KLONOPIN) 1 MG tablet Take 0.5-1 mg by mouth at bedtime.     COVID-19 mRNA vaccine, Pfizer, 30 MCG/0.3ML injection INJECT AS DIRECTED .3 mL 0   hydrOXYzine (ATARAX/VISTARIL) 25 MG tablet TAKE 1 TABLET BY MOUTH AT BEDTIME FOR  INSOMNIA  OR  ANXIETY 30 tablet 3   QUEtiapine (SEROQUEL) 50 MG tablet Take 50-100 mg by mouth at bedtime.     No current facility-administered  medications on file prior to visit.    BP (!) 135/92   Pulse 72   Temp 98.2 F (36.8 C)   Resp 18   Ht 5\' 7"  (1.702 m)   Wt 240 lb (108.9 kg)   SpO2 98%   BMI 37.59 kg/m       Objective:   Physical Exam  General- No acute distress. Pleasant patient. Neck- Full range of motion, no jvd Lungs- Clear, even and unlabored. Heart- regular rate and rhythm. Neurologic- CNII- XII grossly intact.  Abdomen- mild point tender to right of umbilicus. No umbilical hernia felt on exam.  Rectal- (laying on rt side) moderate to large hemorrhoids 3 oclock position. Non thrombosed but mild milder.      Assessment & Plan:   Patient Instructions  Moderate to large hemorrhoids are present.  Prescribed Proctofoam to use twice daily.  Continue with shallow warm salt water bath twice daily as well.  Recommend increasing fiber in diet to reduce constipation.  Went ahead and placed referral to gastroenterologist.  You expressed concern about need for colonoscopy.  This might not be needed presently based on hemorrhoid presents and bright red blood.  Gastroenterologist will decide if that is needed.  Abdomen pain right of umbilicus.  We will get CBC, CMP, lipase and H. pylori breath test.  Prescribing famotidine and encouraging healthy diet as discussed.  Describing some GERD/reflux symptoms as well recently.  Follow-up in 4 to 6 weeks or sooner if needed.    Time spent with patient today was  31 minutes which consisted of chart review, discussing diagnosis, work up treatment and documentation.

## 2021-04-23 ENCOUNTER — Telehealth: Payer: Self-pay

## 2021-04-23 ENCOUNTER — Other Ambulatory Visit: Payer: Self-pay

## 2021-04-23 LAB — H. PYLORI BREATH TEST: H. pylori Breath Test: NOT DETECTED

## 2021-04-23 MED ORDER — PRAMOXINE HCL (PERIANAL) 1 % EX FOAM: 1.0000 "application " | Freq: Three times a day (TID) | CUTANEOUS | 0 refills | Status: DC | PRN

## 2021-04-23 NOTE — Telephone Encounter (Signed)
Caller states he is returning a call from the office. Caller states a prescription was called in for him, but he needs the script sent to another pharmacy.  Telephone: [phone]

## 2021-04-23 NOTE — Telephone Encounter (Signed)
Spoke to patient about results

## 2021-04-28 DIAGNOSIS — F5101 Primary insomnia: Secondary | ICD-10-CM | POA: Diagnosis not present

## 2021-05-12 DIAGNOSIS — M5451 Vertebrogenic low back pain: Secondary | ICD-10-CM | POA: Diagnosis not present

## 2021-05-20 ENCOUNTER — Other Ambulatory Visit: Payer: Self-pay

## 2021-05-20 ENCOUNTER — Encounter: Payer: Self-pay | Admitting: Gastroenterology

## 2021-05-20 ENCOUNTER — Ambulatory Visit (INDEPENDENT_AMBULATORY_CARE_PROVIDER_SITE_OTHER): Payer: BC Managed Care – PPO | Admitting: Gastroenterology

## 2021-05-20 VITALS — BP 142/88 | HR 75 | Ht 67.0 in | Wt 232.0 lb

## 2021-05-20 DIAGNOSIS — K625 Hemorrhage of anus and rectum: Secondary | ICD-10-CM

## 2021-05-20 DIAGNOSIS — K219 Gastro-esophageal reflux disease without esophagitis: Secondary | ICD-10-CM | POA: Diagnosis not present

## 2021-05-20 DIAGNOSIS — K581 Irritable bowel syndrome with constipation: Secondary | ICD-10-CM | POA: Diagnosis not present

## 2021-05-20 MED ORDER — FLUTICASONE PROPIONATE 0.05 % EX CREA: TOPICAL_CREAM | Freq: Two times a day (BID) | CUTANEOUS | 2 refills | Status: DC

## 2021-05-20 MED ORDER — OMEPRAZOLE 20 MG PO CPDR: 20.0000 mg | DELAYED_RELEASE_CAPSULE | Freq: Every day | ORAL | 3 refills | Status: DC

## 2021-05-20 NOTE — Patient Instructions (Addendum)
If you are age 42 or older, your body mass index should be between 23-30. Your Body mass index is 36.34 kg/m. If this is out of the aforementioned range listed, please consider follow up with your Primary Care Provider.  If you are age 9 or younger, your body mass index should be between 19-25. Your Body mass index is 36.34 kg/m. If this is out of the aformentioned range listed, please consider follow up with your Primary Care Provider.   ________________________________________________________  The [facility] providers would like to encourage you to use [facility] to communicate with providers for non-urgent requests or questions.  Due to long hold times on the telephone, sending your provider a message by [facility] may be a faster and more efficient way to get a response.  Please allow 48 business hours for a response.  Please remember that this is for non-urgent requests.  _______________________________________________________  Please purchase the following medications over the counter and take as directed: Benefiber 1 tablespoon daily  We have sent the following medications to your pharmacy for you to pick up at your convenience: Omeprazole Cutivate  Avoid NSAID's Stop Pepcid  You have been scheduled for a colonoscopy. Please follow written instructions given to you at your visit today.  Please pick up your prep supplies at the pharmacy within the next 1-3 days. If you use inhalers (even only as needed), please bring them with you on the day of your procedure.     How to Take a [employer] 2 times a day for 10 days A sitz bath is a warm water bath that may be used to care for your rectum, genital area, or the area between your rectum and genitals (perineum). In a sitz bath, the water only comes up to your hips and covers your buttocks. A sitz bath may be done in a bathtub or with a portable sitz bath that fits over the toilet. Your health care provider may recommend a sitz bath to  help: Relieve pain and discomfort after delivering a baby. Relieve pain and itching from hemorrhoids or anal fissures. Relieve pain after certain surgeries. Relax muscles that are sore or tight. How to take a sitz bath Take 3-4 sitz baths a day, or as many as told by your health care provider. Bathtub sitz bath To take a sitz bath in a bathtub: Partially fill a bathtub with warm water. The water should be deep enough to cover your hips and buttocks when you are sitting in the tub. Follow your health care provider's instructions if you are told to put medicine in the water. Sit in the water. Open the tub drain a little, and leave it open during your bath. Turn on the warm water again, enough to replace the water that is draining out. Keep the water running throughout your bath. This helps keep the water at the right level and temperature. Soak in the water for 15-20 minutes, or as long as told by your health care provider. When you are done, be careful when you stand up. You may feel dizzy. After the sitz bath, pat yourself dry. Do not rub your skin to dry it.  Over-the-toilet sitz bath To take a sitz bath with an over-the-toilet basin: Follow the manufacturer's instructions. Fill the basin with warm water. Follow your health care provider's instructions if you were told to put medicine in the water. Sit on the seat. Make sure the water covers your buttocks and perineum. Soak in the water for 15-20  minutes, or as long as told by your health care provider. After the sitz bath, pat yourself dry. Do not rub your skin to dry it. Clean and dry the basin between uses. Discard the basin if it cracks, or according to the manufacturer's instructions.  Contact a health care provider if: Your pain or itching gets worse. Do not continue with sitz baths if your symptoms get worse. You have new symptoms. Do not continue with sitz baths until you talk with your health care provider. Summary A sitz  bath is a warm water bath in which the water only comes up to your hips and covers your buttocks. A sitz bath may help relieve pain and discomfort after delivering a baby. It also may help with pain and itching from hemorrhoids or anal fissures, or pain after certain surgeries. It can also help to relax muscles that are sore or tight. Take 3-4 sitz baths a day, or as many as told by your health care provider. Soak in the water for 15-20 minutes. Do not continue with sitz baths if your symptoms get worse. This information is not intended to replace advice given to you by your health care provider. Make sure you discuss any questions you have with your health care provider. Document Revised: 02/08/2020 Document Reviewed: 02/08/2020 Elsevier Patient Education  2022 [employer].

## 2021-05-20 NOTE — Progress Notes (Signed)
Chief Complaint:   Referring Provider:  Mackie Pai, PA-C      ASSESSMENT AND PLAN;   #1. Rectal bleeding (likely d/t Gd IV hoids). R/O other causes  #2. IBS-C  #3. GERD   Plan: -Omeprazole 20mg  po qd #30, 11 refills -Stop pepcid -Colon for further eval -Benefiber 1 TBS p.o. QD with 8 oz of water. -fluticasone cream 0.05% generic 30g 1 bid PR x 10 days, 2 refills -Sitz baths BID x 10 days (put epson salt) then PRN -Avoid NSAIDs. -Call if still with problems in 2 weeks. -Work note for today    Discussed risks & benefits of colonoscopy. Risks including rare perforation req laparotomy, bleeding after bx/polypectomy req blood transfusion, rarely missing neoplasms, risks of anesthesia/sedation, rare risk of damage to internal organs. Benefits outweigh the risks. Patient agrees to proceed. All the questions were answered. Pt consents to proceed. HPI:    Wayne Miller is a 39 y.o. male   With intermittent rectal bleeding x 5-6 yrs-mostly bright red in color, at times mixed. Constipation 1 BM in 2days-hard, pellet-like stools, with abdominal bloating and lower abdominal discomfort which gets better with defecation.  No abdo or rectal pain per se  Occ ibuprofen for back pain  Also has been having heartburn intermittently despite Pepcid.  No odynophagia or dysphagia.  No melena.  No recent weight loss.  No sodas, chocolates, chewing gums, artificial sweeteners and candy.  Had problems with hemorrhoids x several years-has tried Preparation H, Proctofoam without any definite benefit.  Has normal labs including CBC  Previous GI work-up: CT AP with contrast 11/2015 1. No acute findings in the abdomen or pelvis. 2. Normal appendix. 3. Mild diverticulosis without diverticulitis.   Past Medical History:  Diagnosis Date   Blood in stool    GERD (gastroesophageal reflux disease)    Herniated nucleus pulposis of lumbosacral region    Multiple gastric ulcers      Past Surgical History:  Procedure Laterality Date   KNEE ARTHROSCOPY Left    left knee scope "tear repair"   LUMBAR LAMINECTOMY/DECOMPRESSION MICRODISCECTOMY Right 09/11/2015   Procedure: MICRO LUMBAR DECOMPRESSION OF RIGHT L4-5 AND L5-S1 AND RIGHT FORAMINOTOMY L5 (2 LEVELS);  Surgeon: Susa Day, MD;  Location: [facility];  Service: Orthopedics;  Laterality: Right;    Family History  Problem Relation Age of Onset   Arthritis Mother    Diabetes Mother    Hyperlipidemia Mother    Hypertension Mother    Arthritis Father    Depression Father    Diabetes Father    Hypertension Father    Kidney disease Father    Birth defects Brother    Arthritis Maternal Grandmother    Hypertension Maternal Grandmother    Alcohol abuse Maternal Grandfather    Prostate cancer Maternal Grandfather    Arthritis Paternal Grandmother    Throat cancer Paternal Aunt    Colon cancer Neg Hx    Rectal cancer Neg Hx    Stomach cancer Neg Hx     Social History   Tobacco Use   Smoking status: Former    Types: Cigarettes    Quit date: 08/29/2005    Years since quitting: 15.7   Smokeless tobacco: Never  Vaping Use   Vaping Use: Never used  Substance Use Topics   Alcohol use: Not Currently    Comment: past -not now   Drug use: No    Current Outpatient Medications  Medication Sig Dispense Refill   clonazePAM (KLONOPIN)  1 MG tablet Take 0.5-1 mg by mouth at bedtime.     COVID-19 mRNA vaccine, Pfizer, 30 MCG/0.3ML injection INJECT AS DIRECTED .3 mL 0   famotidine (PEPCID) 20 MG tablet Take 1 tablet (20 mg total) by mouth daily. 30 tablet 3   hydrOXYzine (ATARAX/VISTARIL) 25 MG tablet TAKE 1 TABLET BY MOUTH AT BEDTIME FOR  INSOMNIA  OR  ANXIETY 30 tablet 3   pramoxine (PROCTOFOAM) 1 % foam Place 1 application rectally 3 (three) times daily as needed for anal itching. 15 g 0   QUEtiapine (SEROQUEL) 50 MG tablet Take 50-100 mg by mouth at bedtime.     No current facility-administered medications for  this visit.    Allergies  Allergen Reactions   Ibuprofen Other (See Comments)    G I upset    Review of Systems:  Constitutional: Denies fever, chills, diaphoresis, appetite change and fatigue.  HEENT: Denies photophobia, eye pain, redness, hearing loss, ear pain, congestion, sore throat, rhinorrhea, sneezing, mouth sores, neck pain, neck stiffness and tinnitus.   Respiratory: Denies SOB, DOE, cough, chest tightness,  and wheezing.   Cardiovascular: Denies chest pain, palpitations and leg swelling.  Genitourinary: Denies dysuria, urgency, frequency, hematuria, flank pain and difficulty urinating.  Musculoskeletal: Denies myalgias, back pain, joint swelling, arthralgias and gait problem.  Skin: No rash.  Neurological: Denies dizziness, seizures, syncope, weakness, light-headedness, numbness and headaches.  Hematological: Denies adenopathy. Easy bruising, personal or family bleeding history  Psychiatric/Behavioral: Has anxiety or depression     Physical Exam:    BP (!) 142/88    Pulse 75    Ht 5\' 7"  (1.702 m)    Wt 232 lb (105.2 kg)    SpO2 98%    BMI 36.34 kg/m  Wt Readings from Last 3 Encounters:  05/20/21 232 lb (105.2 kg)  04/22/21 240 lb (108.9 kg)  10/01/20 237 lb 12.8 oz (107.9 kg)   Constitutional:  Well-developed, in no acute distress. Psychiatric: Normal mood and affect. Behavior is normal. HEENT: Pupils normal.  Conjunctivae are normal. No scleral icterus. Cardiovascular: Normal rate, regular rhythm. No edema Pulmonary/chest: Effort normal and breath sounds normal. No wheezing, rales or rhonchi. Abdominal: Soft, nondistended. Nontender. Bowel sounds active throughout. There are no masses palpable. No hepatomegaly. Rectal: GdIV internal hemorrhoids and external hemorrhoids.  Stool brown, heme-negative.  Examined in presence of Tracy. Neurological: Alert and oriented to person place and time. Skin: Skin is warm and dry. No rashes noted.  Data Reviewed: I have  personally reviewed following labs and imaging studies  CBC: CBC Latest Ref Rng & Units 04/22/2021 10/01/2020 05/11/2018  WBC 4.0 - 10.5 K/uL 6.1 6.5 7.5  Hemoglobin 13.0 - 17.0 g/dL 14.7 15.1 15.4  Hematocrit 39.0 - 52.0 % 44.7 44.9 45.9  Platelets 150.0 - 400.0 K/uL 222.0 239.0 251.0    CMP: CMP Latest Ref Rng & Units 04/22/2021 10/01/2020 05/11/2018  Glucose 70 - 99 mg/dL 92 98 97  BUN 6 - 23 mg/dL 15 15 18   Creatinine 0.40 - 1.50 mg/dL 0.97 0.96 0.99  Sodium 135 - 145 mEq/L 142 138 139  Potassium 3.5 - 5.1 mEq/L 4.5 4.2 4.6  Chloride 96 - 112 mEq/L 109 107 105  CO2 19 - 32 mEq/L 27 25 27   Calcium 8.4 - 10.5 mg/dL 8.8 8.9 9.7  Total Protein 6.0 - 8.3 g/dL 6.6 6.7 7.2  Total Bilirubin 0.2 - 1.2 mg/dL 0.5 0.5 0.7  Alkaline Phos 39 - 117 U/L 70 60 52  AST  0 - 37 U/L 16 14 15   ALT 0 - 53 U/L 20 19 18        Carmell Austria, MD 05/20/2021, 9:52 AM  Cc: Mackie Pai, PA-C

## 2021-06-03 ENCOUNTER — Ambulatory Visit: Payer: BC Managed Care – PPO | Admitting: Medical

## 2021-06-14 DIAGNOSIS — M47816 Spondylosis without myelopathy or radiculopathy, lumbar region: Secondary | ICD-10-CM | POA: Diagnosis not present

## 2021-07-08 ENCOUNTER — Encounter: Payer: Self-pay | Admitting: Gastroenterology

## 2021-07-09 DIAGNOSIS — F5101 Primary insomnia: Secondary | ICD-10-CM | POA: Diagnosis not present

## 2021-07-22 ENCOUNTER — Other Ambulatory Visit: Payer: Self-pay

## 2021-07-22 ENCOUNTER — Encounter: Payer: Self-pay | Admitting: Gastroenterology

## 2021-07-22 ENCOUNTER — Ambulatory Visit (AMBULATORY_SURGERY_CENTER): Payer: BC Managed Care – PPO | Admitting: Gastroenterology

## 2021-07-22 VITALS — BP 134/102 | HR 63 | Temp 98.9°F | Resp 12 | Ht 67.0 in | Wt 232.0 lb

## 2021-07-22 DIAGNOSIS — D120 Benign neoplasm of cecum: Secondary | ICD-10-CM

## 2021-07-22 DIAGNOSIS — K635 Polyp of colon: Secondary | ICD-10-CM

## 2021-07-22 DIAGNOSIS — K625 Hemorrhage of anus and rectum: Secondary | ICD-10-CM

## 2021-07-22 DIAGNOSIS — K640 First degree hemorrhoids: Secondary | ICD-10-CM | POA: Diagnosis not present

## 2021-07-22 DIAGNOSIS — K5730 Diverticulosis of large intestine without perforation or abscess without bleeding: Secondary | ICD-10-CM

## 2021-07-22 MED ORDER — SODIUM CHLORIDE 0.9 % IV SOLN: 500.0000 mL | Freq: Once | INTRAVENOUS | Status: DC

## 2021-07-22 NOTE — Progress Notes (Signed)
Report given to PACU, vss 

## 2021-07-22 NOTE — Progress Notes (Signed)
Chief Complaint:   Referring Provider:  Mackie Pai, PA-C      ASSESSMENT AND PLAN;   #1. Rectal bleeding (likely d/t Gd IV hoids). R/O other causes  #2. IBS-C  #3. GERD   Plan: -Omeprazole 20mg  po qd #30, 11 refills -Stop pepcid -Colon for further eval -Benefiber 1 TBS p.o. QD with 8 oz of water. -fluticasone cream 0.05% generic 30g 1 bid PR x 10 days, 2 refills -Sitz baths BID x 10 days (put epson salt) then PRN -Avoid NSAIDs. -Call if still with problems in 2 weeks. -Work note for today    Discussed risks & benefits of colonoscopy. Risks including rare perforation req laparotomy, bleeding after bx/polypectomy req blood transfusion, rarely missing neoplasms, risks of anesthesia/sedation, rare risk of damage to internal organs. Benefits outweigh the risks. Patient agrees to proceed. All the questions were answered. Pt consents to proceed. HPI:    Wayne Miller is a 41 y.o. male   With intermittent rectal bleeding x 5-6 yrs-mostly bright red in color, at times mixed. Constipation 1 BM in 2days-hard, pellet-like stools, with abdominal bloating and lower abdominal discomfort which gets better with defecation.  No abdo or rectal pain per se  Occ ibuprofen for back pain  Also has been having heartburn intermittently despite Pepcid.  No odynophagia or dysphagia.  No melena.  No recent weight loss.  No sodas, chocolates, chewing gums, artificial sweeteners and candy.  Had problems with hemorrhoids x several years-has tried Preparation H, Proctofoam without any definite benefit.  Has normal labs including CBC  Previous GI work-up: CT AP with contrast 11/2015 1. No acute findings in the abdomen or pelvis. 2. Normal appendix. 3. Mild diverticulosis without diverticulitis.   Past Medical History:  Diagnosis Date   Blood in stool    GERD (gastroesophageal reflux disease)    Herniated nucleus pulposis of lumbosacral region    Multiple gastric ulcers      Past Surgical History:  Procedure Laterality Date   KNEE ARTHROSCOPY Left    left knee scope "tear repair"   LUMBAR LAMINECTOMY/DECOMPRESSION MICRODISCECTOMY Right 09/11/2015   Procedure: MICRO LUMBAR DECOMPRESSION OF RIGHT L4-5 AND L5-S1 AND RIGHT FORAMINOTOMY L5 (2 LEVELS);  Surgeon: Susa Day, MD;  Location: [facility];  Service: Orthopedics;  Laterality: Right;    Family History  Problem Relation Age of Onset   Arthritis Mother    Diabetes Mother    Hyperlipidemia Mother    Hypertension Mother    Arthritis Father    Depression Father    Diabetes Father    Hypertension Father    Kidney disease Father    Birth defects Brother    Arthritis Maternal Grandmother    Hypertension Maternal Grandmother    Alcohol abuse Maternal Grandfather    Prostate cancer Maternal Grandfather    Arthritis Paternal Grandmother    Throat cancer Paternal Aunt    Colon cancer Neg Hx    Rectal cancer Neg Hx    Stomach cancer Neg Hx     Social History   Tobacco Use   Smoking status: Former    Types: Cigarettes    Quit date: 08/29/2005    Years since quitting: 15.9   Smokeless tobacco: Never  Vaping Use   Vaping Use: Never used  Substance Use Topics   Alcohol use: Not Currently    Comment: past -not now   Drug use: No    Current Outpatient Medications  Medication Sig Dispense Refill   clonazePAM (KLONOPIN)  1 MG tablet Take 0.5-1 mg by mouth at bedtime.     famotidine (PEPCID) 20 MG tablet Take 1 tablet (20 mg total) by mouth daily. 30 tablet 3   fluticasone (CUTIVATE) 0.05 % cream Apply topically 2 (two) times daily. For 10 days apply to rectum area 30 g 2   hydrOXYzine (ATARAX/VISTARIL) 25 MG tablet TAKE 1 TABLET BY MOUTH AT BEDTIME FOR  INSOMNIA  OR  ANXIETY 30 tablet 3   omeprazole (PRILOSEC) 20 MG capsule Take 1 capsule (20 mg total) by mouth daily. 90 capsule 3   pramoxine (PROCTOFOAM) 1 % foam Place 1 application rectally 3 (three) times daily as needed for anal itching. 15 g 0    QUEtiapine (SEROQUEL) 50 MG tablet Take 50-100 mg by mouth at bedtime.     Current Facility-Administered Medications  Medication Dose Route Frequency Provider Last Rate Last Admin   0.9 %  sodium chloride infusion  500 mL Intravenous Once Jackquline Denmark, MD        Allergies  Allergen Reactions   Ibuprofen Other (See Comments)    G I upset    Review of Systems:  Constitutional: Denies fever, chills, diaphoresis, appetite change and fatigue.  HEENT: Denies photophobia, eye pain, redness, hearing loss, ear pain, congestion, sore throat, rhinorrhea, sneezing, mouth sores, neck pain, neck stiffness and tinnitus.   Respiratory: Denies SOB, DOE, cough, chest tightness,  and wheezing.   Cardiovascular: Denies chest pain, palpitations and leg swelling.  Genitourinary: Denies dysuria, urgency, frequency, hematuria, flank pain and difficulty urinating.  Musculoskeletal: Denies myalgias, back pain, joint swelling, arthralgias and gait problem.  Skin: No rash.  Neurological: Denies dizziness, seizures, syncope, weakness, light-headedness, numbness and headaches.  Hematological: Denies adenopathy. Easy bruising, personal or family bleeding history  Psychiatric/Behavioral: Has anxiety or depression     Physical Exam:    BP (!) 145/102    Pulse 73    Temp 98.9 F (37.2 C)    Ht 5\' 7"  (1.702 m)    Wt 232 lb (105.2 kg)    SpO2 98%    BMI 36.34 kg/m  Wt Readings from Last 3 Encounters:  07/22/21 232 lb (105.2 kg)  05/20/21 232 lb (105.2 kg)  04/22/21 240 lb (108.9 kg)   Constitutional:  Well-developed, in no acute distress. Psychiatric: Normal mood and affect. Behavior is normal. HEENT: Pupils normal.  Conjunctivae are normal. No scleral icterus. Cardiovascular: Normal rate, regular rhythm. No edema Pulmonary/chest: Effort normal and breath sounds normal. No wheezing, rales or rhonchi. Abdominal: Soft, nondistended. Nontender. Bowel sounds active throughout. There are no masses palpable. No  hepatomegaly. Rectal: GdIV internal hemorrhoids and external hemorrhoids.  Stool brown, heme-negative.  Examined in presence of Tracy. Neurological: Alert and oriented to person place and time. Skin: Skin is warm and dry. No rashes noted.  Data Reviewed: I have personally reviewed following labs and imaging studies  CBC: CBC Latest Ref Rng & Units 04/22/2021 10/01/2020 05/11/2018  WBC 4.0 - 10.5 K/uL 6.1 6.5 7.5  Hemoglobin 13.0 - 17.0 g/dL 14.7 15.1 15.4  Hematocrit 39.0 - 52.0 % 44.7 44.9 45.9  Platelets 150.0 - 400.0 K/uL 222.0 239.0 251.0    CMP: CMP Latest Ref Rng & Units 04/22/2021 10/01/2020 05/11/2018  Glucose 70 - 99 mg/dL 92 98 97  BUN 6 - 23 mg/dL 15 15 18   Creatinine 0.40 - 1.50 mg/dL 0.97 0.96 0.99  Sodium 135 - 145 mEq/L 142 138 139  Potassium 3.5 - 5.1 mEq/L 4.5 4.2 4.6  Chloride 96 - 112 mEq/L 109 107 105  CO2 19 - 32 mEq/L 27 25 27   Calcium 8.4 - 10.5 mg/dL 8.8 8.9 9.7  Total Protein 6.0 - 8.3 g/dL 6.6 6.7 7.2  Total Bilirubin 0.2 - 1.2 mg/dL 0.5 0.5 0.7  Alkaline Phos 39 - 117 U/L 70 60 52  AST 0 - 37 U/L 16 14 15   ALT 0 - 53 U/L 20 19 18        Carmell Austria, MD 07/22/2021, 10:52 AM  Cc: Mackie Pai, PA-C

## 2021-07-22 NOTE — Progress Notes (Signed)
Called to room to assist during endoscopic procedure.  Patient ID and intended procedure confirmed with present staff. Received instructions for my participation in the procedure from the performing physician.  

## 2021-07-22 NOTE — Op Note (Signed)
[facility] Patient Name: Wayne Miller Procedure Date: 07/22/2021 11:02 AM MRN: [id number] Endoscopist: Jackquline Denmark , MD Age: 41 Referring MD:  Date of Birth: 01/07/1981 Gender: Male Account #: [id number] Procedure:                Colonoscopy Indications:              Rectal bleeding Medicines:                Monitored Anesthesia Care Procedure:                Pre-Anesthesia Assessment:                           - Prior to the procedure, a History and Physical                            was performed, and patient medications and                            allergies were reviewed. The patient's tolerance of                            previous anesthesia was also reviewed. The risks                            and benefits of the procedure and the sedation                            options and risks were discussed with the patient.                            All questions were answered, and informed consent                            was obtained. Prior Anticoagulants: The patient has                            taken no previous anticoagulant or antiplatelet                            agents. ASA Grade Assessment: II - A patient with                            mild systemic disease. After reviewing the risks                            and benefits, the patient was deemed in                            satisfactory condition to undergo the procedure.                           After obtaining informed consent, the colonoscope  was passed under direct vision. Throughout the                            procedure, the patient's blood pressure, pulse, and                            oxygen saturations were monitored continuously. The                            Olympus CF-HQ190L ([id number]) Colonoscope was                            introduced through the anus and advanced to the 2                            cm into the ileum. The colonoscopy was performed                             without difficulty. The patient tolerated the                            procedure well. The quality of the bowel                            preparation was good. The terminal ileum, ileocecal                            valve, appendiceal orifice, and rectum were                            photographed. Scope In: 11:07:14 AM Scope Out: 11:19:36 AM Scope Withdrawal Time: 0 hours 9 minutes 30 seconds  Total Procedure Duration: 0 hours 12 minutes 22 seconds  Findings:                 A 6 mm polyp was found in the cecum. The polyp was                            sessile. The polyp was removed with a cold snare.                            Resection and retrieval were complete.                           A few rare small-mouthed diverticula were found in                            the sigmoid colon.                           Non-bleeding external and internal hemorrhoids were                            found during retroflexion and during perianal exam.  The hemorrhoids were moderate and Grade I (internal                            hemorrhoids that do not prolapse).                           The terminal ileum appeared normal.                           The exam was otherwise without abnormality on                            direct and retroflexion views. Complications:            No immediate complications. Estimated Blood Loss:     Estimated blood loss: none. Impression:               - One 6 mm polyp in the cecum, removed with a cold                            snare. Resected and retrieved.                           - Minimal sigmoid diverticulosis.                           - Non-bleeding external and internal hemorrhoids.                           - The examined portion of the ileum was normal.                           - The examination was otherwise normal on direct                            and retroflexion views. Recommendation:            - Patient has a contact number available for                            emergencies. The signs and symptoms of potential                            delayed complications were discussed with the                            patient. Return to normal activities tomorrow.                            Written discharge instructions were provided to the                            patient.                           - Resume previous diet.                           -  Use Benefiber one teaspoon PO daily.                           - fluticasone cream 0.05% generic 30g 1 bid PR x 10                            days, 2 refills                           - Sitz baths BID x 10 days (put epson salt) then PRN                           - Await pathology results.                           - If continued anorectal problems, would recommend                            surgical evaluation for EUA/hemorrhoidectomy. The                            hemorrhoids would not be amenable to banding. Jackquline Denmark, MD 07/22/2021 11:26:47 AM This report has been signed electronically.

## 2021-07-22 NOTE — Patient Instructions (Signed)
YOU HAD AN ENDOSCOPIC PROCEDURE TODAY AT THE [facility]:   Refer to the procedure report that was given to you for any specific questions about what was found during the examination.  If the procedure report does not answer your questions, please call your gastroenterologist to clarify.  If you requested that your care partner not be given the details of your procedure findings, then the procedure report has been included in a sealed envelope for you to review at your convenience later.  YOU SHOULD EXPECT: Some feelings of bloating in the abdomen. Passage of more gas than usual.  Walking can help get rid of the air that was put into your GI tract during the procedure and reduce the bloating. If you had a lower endoscopy (such as a colonoscopy or flexible sigmoidoscopy) you may notice spotting of blood in your stool or on the toilet paper. If you underwent a bowel prep for your procedure, you may not have a normal bowel movement for a few days.  Please Note:  You might notice some irritation and congestion in your nose or some drainage.  This is from the oxygen used during your procedure.  There is no need for concern and it should clear up in a day or so.  SYMPTOMS TO REPORT IMMEDIATELY:   Following lower endoscopy (colonoscopy or flexible sigmoidoscopy):  Excessive amounts of blood in the stool  Significant tenderness or worsening of abdominal pains  Swelling of the abdomen that is new, acute  Fever of 100F or higher  For urgent or emergent issues, a gastroenterologist can be reached at any hour by calling [phone]. Do not use MyChart messaging for urgent concerns.    DIET:  We do recommend a small meal at first, but then you may proceed to your regular diet.  Drink plenty of fluids but you should avoid alcoholic beverages for 24 hours.  ACTIVITY:  You should plan to take it easy for the rest of today and you should NOT DRIVE or use heavy machinery until tomorrow (because  of the sedation medicines used during the test).    FOLLOW UP: Our staff will call the number listed on your records 48-72 hours following your procedure to check on you and address any questions or concerns that you may have regarding the information given to you following your procedure. If we do not reach you, we will leave a message.  We will attempt to reach you two times.  During this call, we will ask if you have developed any symptoms of COVID 19. If you develop any symptoms (ie: fever, flu-like symptoms, shortness of breath, cough etc.) before then, please call [phone].  If you test positive for Covid 19 in the 2 weeks post procedure, please call and report this information to us.    If any biopsies were taken you will be contacted by phone or by letter within the next 1-3 weeks.  Please call us at [phone] if you have not heard about the biopsies in 3 weeks.    SIGNATURES/CONFIDENTIALITY: You and/or your care partner have signed paperwork which will be entered into your electronic medical record.  These signatures attest to the fact that that the information above on your After Visit Summary has been reviewed and is understood.  Full responsibility of the confidentiality of this discharge information lies with you and/or your care-partner. 

## 2021-07-23 ENCOUNTER — Other Ambulatory Visit: Payer: Self-pay

## 2021-07-23 DIAGNOSIS — K649 Unspecified hemorrhoids: Secondary | ICD-10-CM

## 2021-07-23 MED ORDER — FLUTICASONE PROPIONATE 0.05 % EX CREA: TOPICAL_CREAM | Freq: Two times a day (BID) | CUTANEOUS | 2 refills | Status: DC

## 2021-07-24 ENCOUNTER — Telehealth: Payer: Self-pay | Admitting: *Deleted

## 2021-07-24 NOTE — Telephone Encounter (Signed)
  Follow up Call-  Call back number 07/22/2021  Post procedure Call Back phone  # [phone] (wife)  Permission to leave phone message Yes  Some recent data might be hidden     Patient questions:  Do you have a fever, pain , or abdominal swelling? No. Pain Score  0 *  Have you tolerated food without any problems? Yes.    Have you been able to return to your normal activities? Yes.    Do you have any questions about your discharge instructions: Diet   No. Medications  No. Follow up visit  No.  Do you have questions or concerns about your Care? No.  Actions: * If pain score is 4 or above: No action needed, pain <4.

## 2021-08-04 ENCOUNTER — Encounter: Payer: Self-pay | Admitting: Gastroenterology

## 2021-09-01 DIAGNOSIS — F5101 Primary insomnia: Secondary | ICD-10-CM | POA: Diagnosis not present

## 2021-10-22 IMAGING — CR DG LUMBAR SPINE COMPLETE 4+V
5 series · 5 of 5 positions shown · non-contrast
Comparison: 01/27/2019

CLINICAL DATA: Acute on chronic midline back pain

EXAM:
LUMBAR SPINE - COMPLETE 4+ VIEW

[l-spine ap]
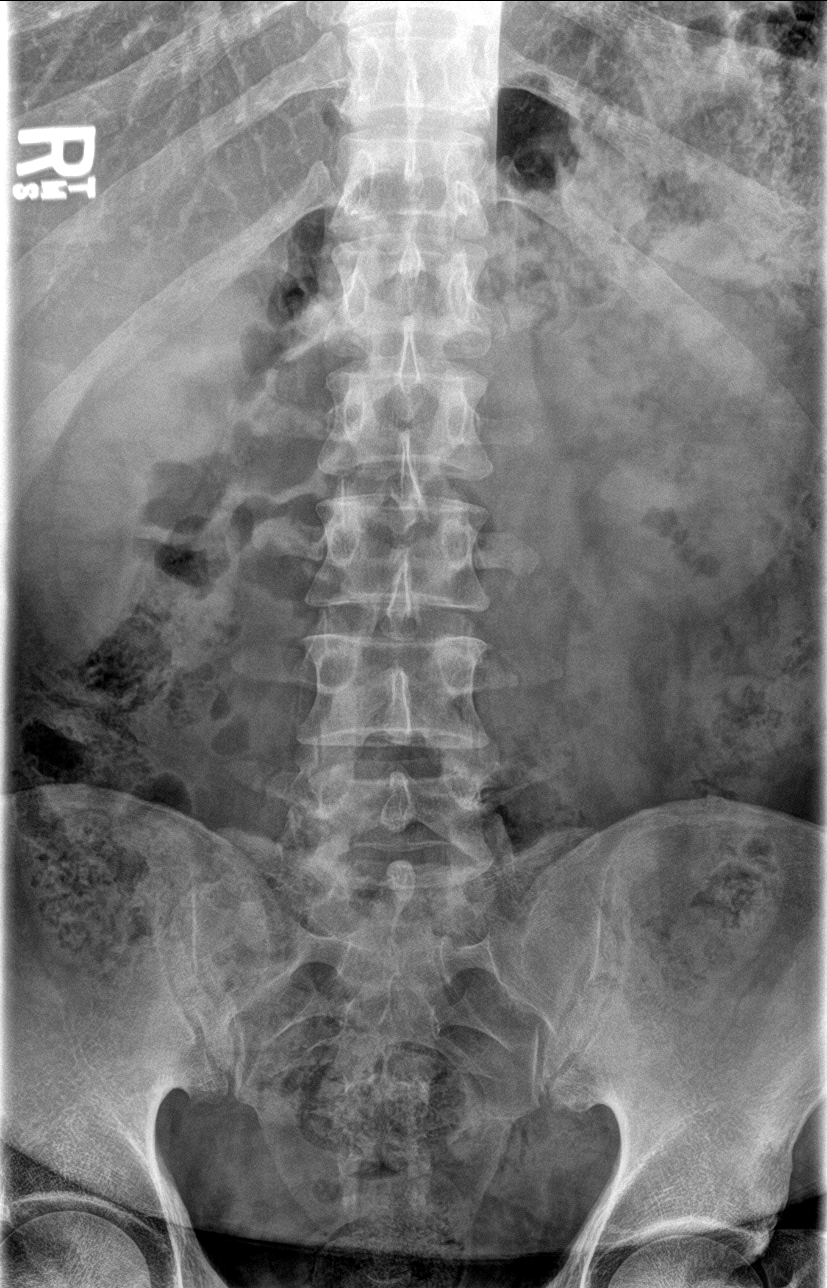

[l-spine obl (1 of 2)]
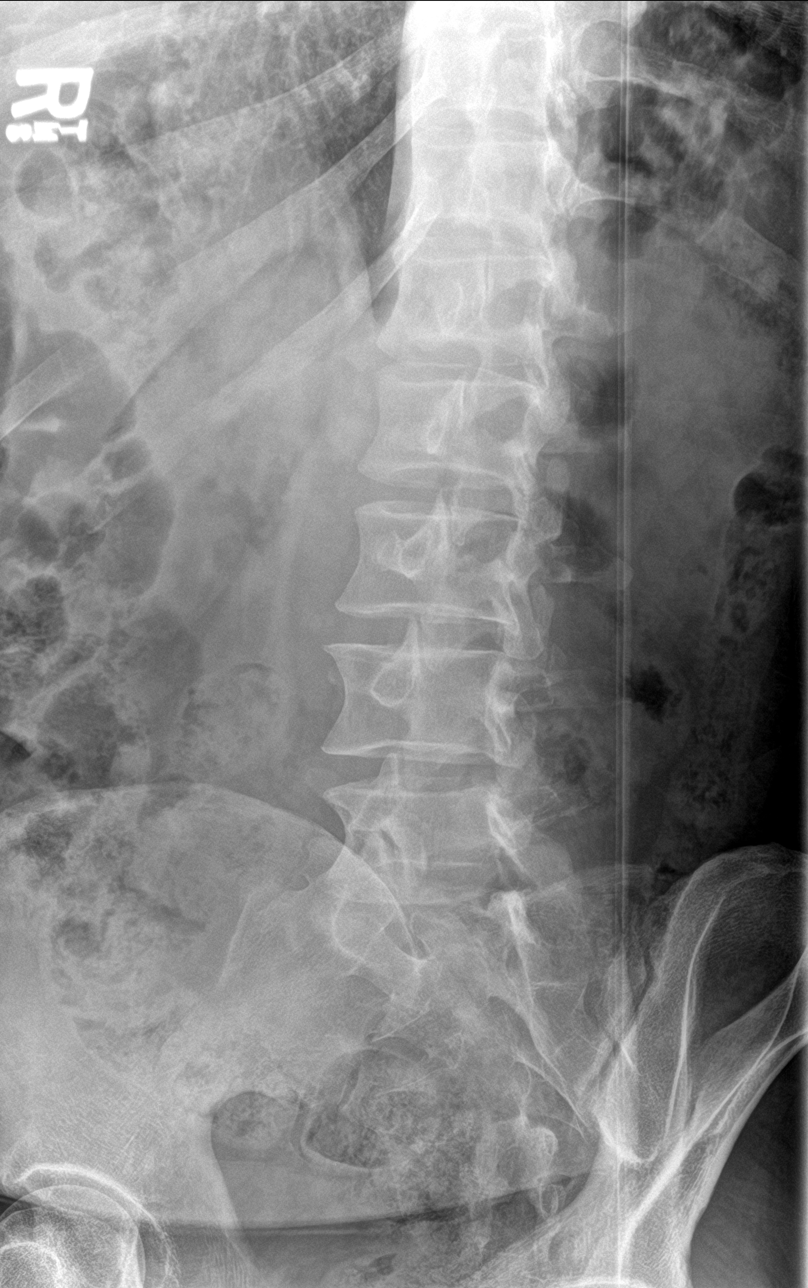

[l-spine obl (2 of 2)]
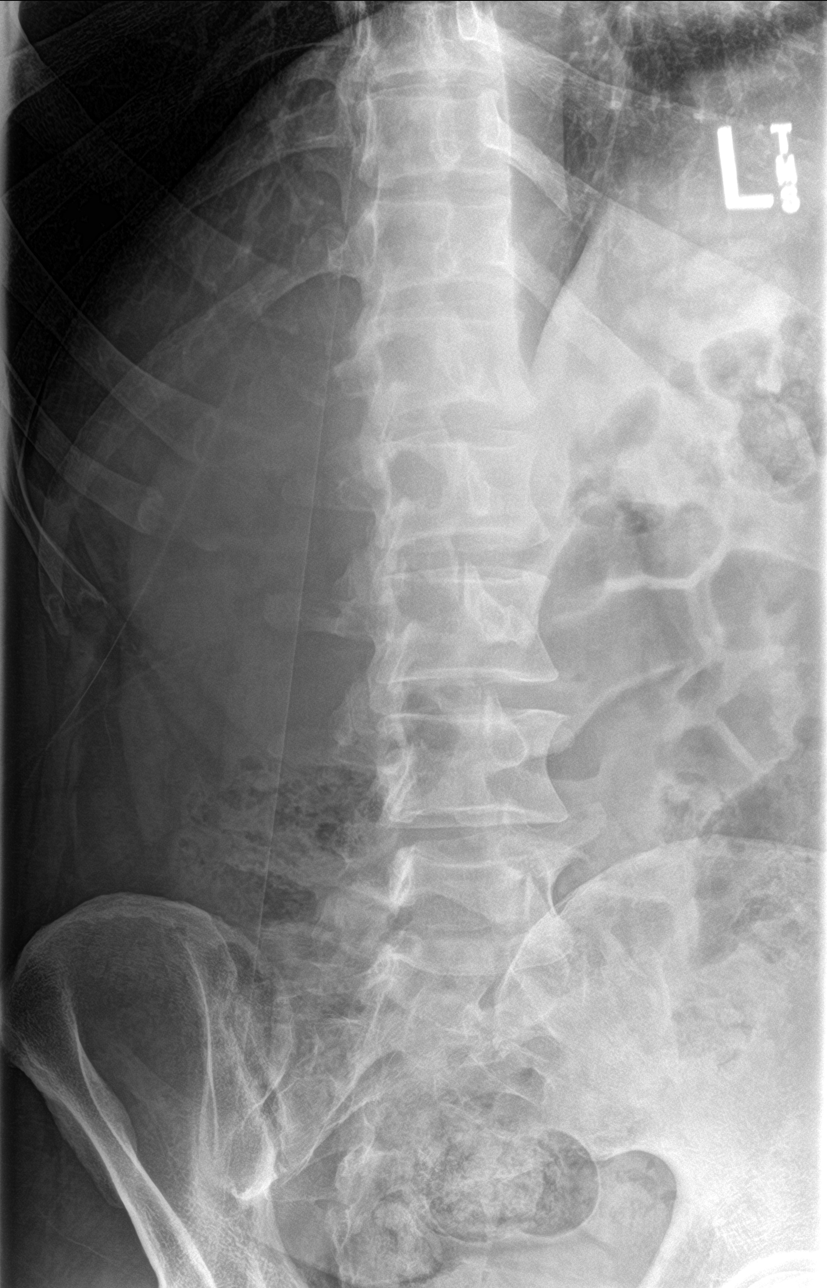

[l-spine lat]
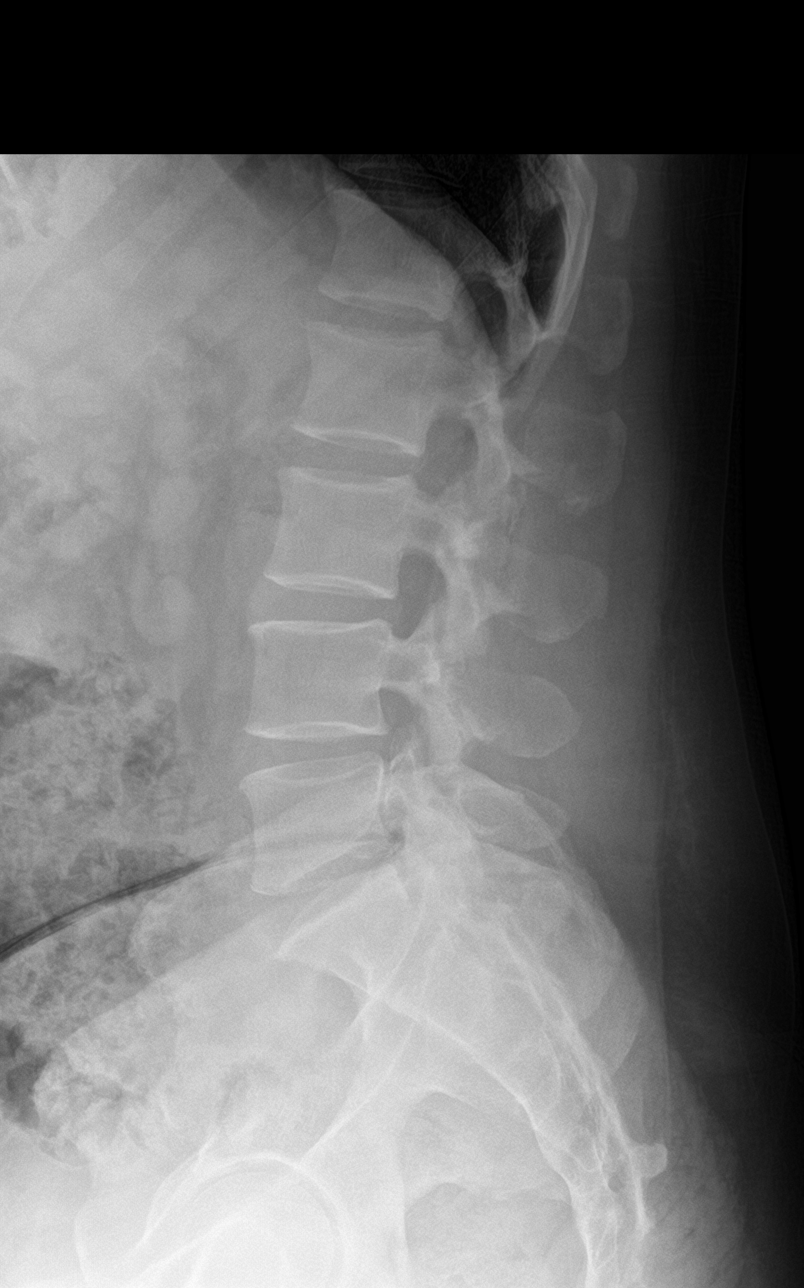

[l-spine spot]
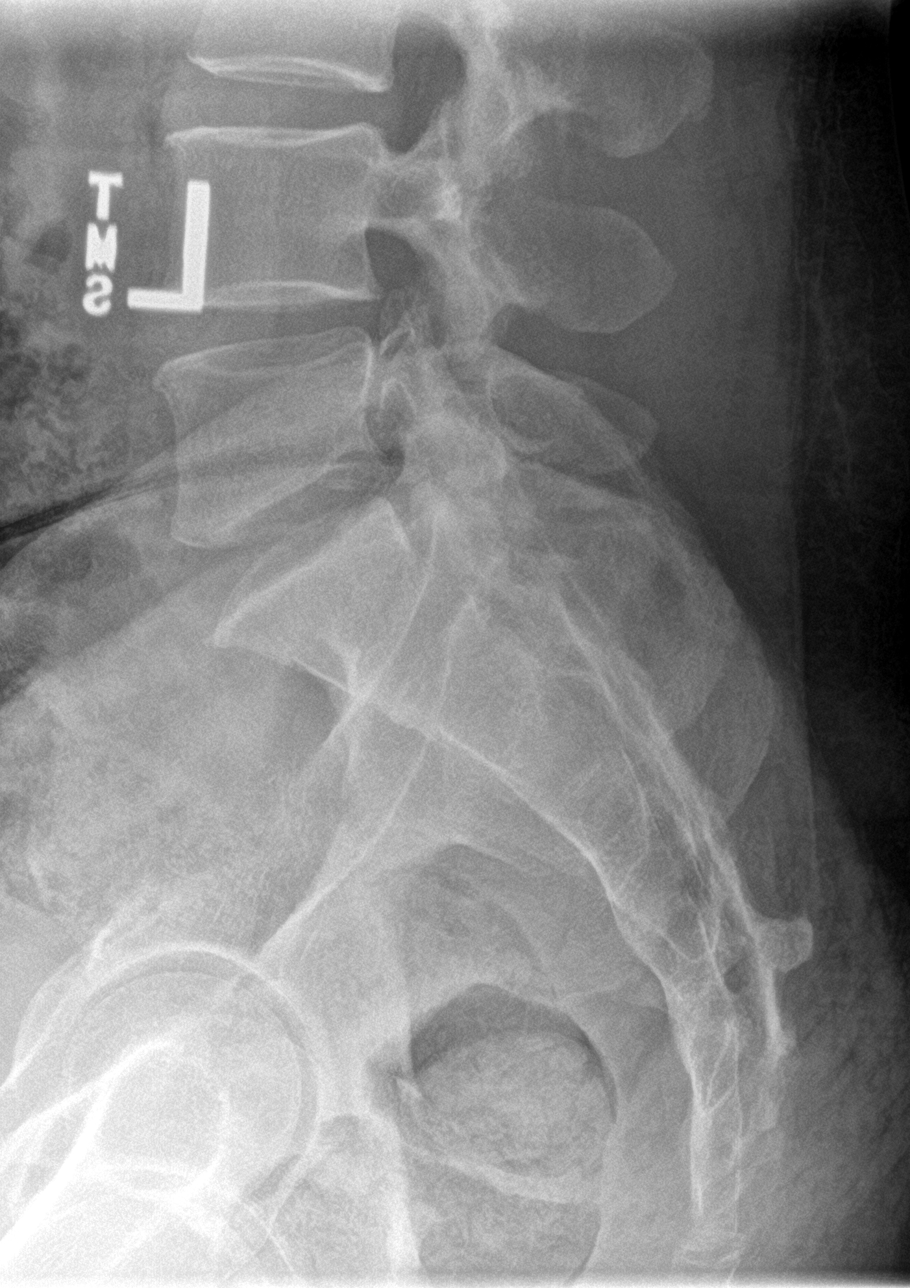

[5 of 5 positions shown; findings below may reference images not displayed]

FINDINGS: Degenerative facet disease at L5-S1. Disc spaces maintained. Normal
alignment. No fracture.
IMPRESSION: Mild degenerative facet disease in the lower lumbar spine. No acute
bony abnormality.

## 2021-10-28 DIAGNOSIS — F5101 Primary insomnia: Secondary | ICD-10-CM | POA: Diagnosis not present

## 2021-11-10 ENCOUNTER — Encounter: Payer: Self-pay | Admitting: Medical

## 2021-12-23 DIAGNOSIS — F5101 Primary insomnia: Secondary | ICD-10-CM | POA: Diagnosis not present

## 2021-12-24 ENCOUNTER — Ambulatory Visit (INDEPENDENT_AMBULATORY_CARE_PROVIDER_SITE_OTHER): Payer: BC Managed Care – PPO | Admitting: Medical

## 2021-12-24 VITALS — BP 135/87 | HR 64 | Temp 98.0°F | Resp 16 | Ht 67.0 in | Wt 223.8 lb

## 2021-12-24 DIAGNOSIS — F418 Other specified anxiety disorders: Secondary | ICD-10-CM | POA: Diagnosis not present

## 2021-12-24 DIAGNOSIS — E010 Iodine-deficiency related diffuse (endemic) goiter: Secondary | ICD-10-CM | POA: Diagnosis not present

## 2021-12-24 DIAGNOSIS — F5103 Paradoxical insomnia: Secondary | ICD-10-CM | POA: Diagnosis not present

## 2021-12-24 LAB — T4, FREE: Free T4: 0.71 ng/dL (ref 0.60–1.60)

## 2021-12-24 LAB — TSH: TSH: 0.86 u[IU]/mL (ref 0.35–5.50)

## 2021-12-24 NOTE — Patient Instructions (Addendum)
Your psychiatrist mentioned to you that thyroid issue may be contributing factor to your insomnia. On exam you may have mild enlarged thyroid. Will get thyroid US as well as thyroid blood work.   For anxiety and insomnia continue with seroquel and follow up with psychiatrist as regularly  scheduled.  Jerrye Bushy- symptoms resolved with better diet and wt loss. Glad to hear not requiring medication.  Would ask you follow up with me late September or early October for wellness exam.

## 2021-12-24 NOTE — Progress Notes (Signed)
Subjective:    Patient ID: Wayne Miller, male    DOB: [birth year], 41 y.o.   MRN: [id number]  HPI  Pt in for evaluation. Pt states he has concerns for possible thyroid disorder.  Pt states this was mentioned that he may have thyroid disorder. He has not anterior thyroid area pain. He does not report fatigue. Pt states psychiatrist thinks thyroid maybe cause of his insomnia.  Pt is seeing psychiatrist for anxiety and has some insomnia as well. He is on seroquel. He states does not help him sleep.  Jerrye Bushy is controlled recently. Not needing meds. Eating healthier and has lost weight with better diet.  Review of Systems  Constitutional:  Negative for chills, fatigue and fever.  HENT:  Negative for congestion and ear pain.   Respiratory:  Negative for cough, chest tightness, shortness of breath and wheezing.   Cardiovascular:  Negative for chest pain and palpitations.  Gastrointestinal:  Negative for abdominal pain, constipation and nausea.  Musculoskeletal:  Negative for back pain.  Skin:  Negative for rash.  Neurological:  Negative for dizziness, numbness and headaches.  Hematological:  Negative for adenopathy.  Psychiatric/Behavioral:  Positive for sleep disturbance. Negative for behavioral problems and suicidal ideas. The patient is nervous/anxious.    Past Medical History:  Diagnosis Date   Blood in stool    GERD (gastroesophageal reflux disease)    Herniated nucleus pulposis of lumbosacral region    Multiple gastric ulcers      Social History   Socioeconomic History   Marital status: Married    Spouse name: Not on file   Number of children: 3   Years of education: Not on file   Highest education level: Not on file  Occupational History   Not on file  Tobacco Use   Smoking status: Former    Types: Cigarettes    Quit date: 08/29/2005    Years since quitting: 16.3   Smokeless tobacco: Never  Vaping Use   Vaping Use: Never used  Substance and Sexual Activity    Alcohol use: Not Currently    Comment: past -not now   Drug use: No   Sexual activity: Yes  Other Topics Concern   Not on file  Social History Narrative   ** Merged History Encounter **       Social Determinants of Health   Financial Resource Strain: Not on file  Food Insecurity: Not on file  Transportation Needs: Not on file  Physical Activity: Not on file  Stress: Not on file  Social Connections: Not on file  Intimate Partner Violence: Not on file    Past Surgical History:  Procedure Laterality Date   KNEE ARTHROSCOPY Left    left knee scope "tear repair"   LUMBAR LAMINECTOMY/DECOMPRESSION MICRODISCECTOMY Right 09/11/2015   Procedure: MICRO LUMBAR DECOMPRESSION OF RIGHT L4-5 AND L5-S1 AND RIGHT FORAMINOTOMY L5 (2 LEVELS);  Surgeon: Susa Day, MD;  Location: [facility];  Service: Orthopedics;  Laterality: Right;    Family History  Problem Relation Age of Onset   Arthritis Mother    Diabetes Mother    Hyperlipidemia Mother    Hypertension Mother    Arthritis Father    Depression Father    Diabetes Father    Hypertension Father    Kidney disease Father    Birth defects Brother    Throat cancer Paternal Aunt    Arthritis Maternal Grandmother    Hypertension Maternal Grandmother    Alcohol abuse Maternal Grandfather  Prostate cancer Maternal Grandfather    Arthritis Paternal Grandmother    Colon cancer Neg Hx    Rectal cancer Neg Hx    Stomach cancer Neg Hx    Esophageal cancer Neg Hx     Allergies  Allergen Reactions   Ibuprofen Other (See Comments)    G I upset    Current Outpatient Medications on File Prior to Visit  Medication Sig Dispense Refill   clonazePAM (KLONOPIN) 1 MG tablet Take 0.5-1 mg by mouth at bedtime.     famotidine (PEPCID) 20 MG tablet Take 1 tablet (20 mg total) by mouth daily. 30 tablet 3   fluticasone (CUTIVATE) 0.05 % cream Apply topically 2 (two) times daily. For 10 days apply to rectum area 30 g 2   fluticasone (CUTIVATE) 0.05 %  cream Apply topically 2 (two) times daily. Apply Per Rectum twice a day for 10 days 30 g 2   hydrOXYzine (ATARAX/VISTARIL) 25 MG tablet TAKE 1 TABLET BY MOUTH AT BEDTIME FOR  INSOMNIA  OR  ANXIETY 30 tablet 3   omeprazole (PRILOSEC) 20 MG capsule Take 1 capsule (20 mg total) by mouth daily. 90 capsule 3   pramoxine (PROCTOFOAM) 1 % foam Place 1 application rectally 3 (three) times daily as needed for anal itching. 15 g 0   QUEtiapine (SEROQUEL) 50 MG tablet Take 50-100 mg by mouth at bedtime.     No current facility-administered medications on file prior to visit.    BP 135/87 (BP Location: Right Arm, Patient Position: Sitting, Cuff Size: Normal)   Pulse 64   Temp 98 F (36.7 C) (Oral)   Resp 16   Ht '5\' 7"'$  (1.702 m)   Wt 223 lb 12.8 oz (101.5 kg)   SpO2 97%   BMI 35.05 kg/m        Objective:   Physical Exam General- No acute distress. Pleasant patient. Neck- Full range of motion, no jvd. On palpation borderline large symmetric thyroid.  Lungs- Clear, even and unlabored. Heart- regular rate and rhythm. Neurologic- CNII- XII grossly intact.        Assessment & Plan:   Patient Instructions  Your psychiatrist mentioned to you that thyroid issue may be contributing factor to your insomnia. On exam you may have mild enlarged thyroid. Will get thyroid US as well as thyroid blood work.   For anxiety and insomnia continue with seroquel and follow up with psychiatrist as regularly  scheduled.  Jerrye Bushy- symptoms resolved with better diet and wt loss. Glad to hear not requiring medication.  Would ask you follow up with me late September or early October for wellness exam.     Mackie Pai, PA-C

## 2021-12-27 ENCOUNTER — Ambulatory Visit (HOSPITAL_BASED_OUTPATIENT_CLINIC_OR_DEPARTMENT_OTHER)
Admission: RE | Admit: 2021-12-27 | Discharge: 2021-12-27 | Disposition: A | Discharge status: Home / Self Care | Payer: BC Managed Care – PPO | Source: Ambulatory Visit | Attending: Medical | Admitting: Medical

## 2021-12-27 DIAGNOSIS — E041 Nontoxic single thyroid nodule: Secondary | ICD-10-CM | POA: Diagnosis not present

## 2021-12-27 DIAGNOSIS — E010 Iodine-deficiency related diffuse (endemic) goiter: Secondary | ICD-10-CM | POA: Diagnosis not present

## 2021-12-29 NOTE — Progress Notes (Unsigned)
12/31/2021 Wayne Miller [id number] 06-23-1980  Referring provider: Mackie Pai, PA-C Primary GI doctor: Dr. Lyndel Safe  ASSESSMENT AND PLAN:   Rectal bleeding secondary to internal/external hemorrhoids -     Ambulatory referral to General Surgery -     hydrocortisone (ANUSOL-HC) 25 MG suppository; Place 1 suppository (25 mg total) rectally 2 (two) times daily. -Sitz baths, increase fiber, add miralax, increase water, need to fix toilet habits.  -Hydrocortisone supp give and external cream sent in.  -We discussed surgical treatment, since he is not improving with cream will plan for referral.  Benign neoplasm of cecum 07/22/2021 colonoscopy with Dr. Lyndel Safe for rectal bleeding 85m hyperplastic polyp, sigmoid diverticulosis, external and internal hemorrhoids normal terminal ileum.  Recall 10 years.  Diverticulosis of colon without hemorrhage Will call if any symptoms. Add on fiber supplement, avoid NSAIDS, information given   History of Present Illness:  41y.o. male  with a past medical history of anxiety, insomnia, GERD history of gastric ulcers and others listed below, returns to clinic today for evaluation of rectal bleeding.  07/22/2021 colonoscopy with Dr. GLyndel Safefor rectal bleeding 673mhyperplastic polyp, sigmoid diverticulosis, external and internal hemorrhoids normal terminal ileum.  Recall 10 years.  He will have BRB on TP still.  Using external cream and this is not helping.  He has BM every 2 days, with straining.  He has rectal pain at times with straining.  He is on a fiber supplement but no miralax.   Recommend surgical evaluation for EUA/hemorrhoidectomy.  Current Medications:        Current Outpatient Medications (Other):    fluticasone (CUTIVATE) 0.05 % cream, Apply topically 2 (two) times daily. For 10 days apply to rectum area   fluticasone (CUTIVATE) 0.05 % cream, Apply topically 2 (two) times daily. Apply Per Rectum twice a day for 10 days    hydrocortisone (ANUSOL-HC) 25 MG suppository, Place 1 suppository (25 mg total) rectally 2 (two) times daily.   pramoxine (PROCTOFOAM) 1 % foam, Place 1 application rectally 3 (three) times daily as needed for anal itching.   QUEtiapine (SEROQUEL) 50 MG tablet, Take 50-100 mg by mouth at bedtime.   clonazePAM (KLONOPIN) 1 MG tablet, Take 0.5-1 mg by mouth at bedtime. (Patient not taking: Reported on 12/31/2021)   famotidine (PEPCID) 20 MG tablet, Take 1 tablet (20 mg total) by mouth daily. (Patient not taking: Reported on 12/31/2021)   hydrOXYzine (ATARAX/VISTARIL) 25 MG tablet, TAKE 1 TABLET BY MOUTH AT BEDTIME FOR  INSOMNIA  OR  ANXIETY (Patient not taking: Reported on 12/31/2021)   omeprazole (PRILOSEC) 20 MG capsule, Take 1 capsule (20 mg total) by mouth daily. (Patient not taking: Reported on 12/31/2021)  Surgical History:  He  has a past surgical history that includes Knee arthroscopy (Left) and Lumbar laminectomy/decompression microdiscectomy (Right, 09/11/2015). Family History:  His family history includes Alcohol abuse in his maternal grandfather; Arthritis in his father, maternal grandmother, mother, and paternal grandmother; Birth defects in his brother; Depression in his father; Diabetes in his father and mother; Hyperlipidemia in his mother; Hypertension in his father, maternal grandmother, and mother; Kidney disease in his father; Prostate cancer in his maternal grandfather; Throat cancer in his paternal aunt. Social History:   reports that he quit smoking about 16 years ago. His smoking use included cigarettes. He has never used smokeless tobacco. He reports that he does not currently use alcohol. He reports that he does not use drugs.  Current Medications, Allergies, Past Medical History,  Past Surgical History, Family History and Social History were reviewed in [employer] record.  Physical Exam: BP 126/86   Pulse 78   Ht '5\' 7"'$  (1.702 m)   Wt 222 lb 8 oz (100.9  kg)   BMI 34.85 kg/m  General:   Pleasant, well developed male in no acute distress Abdomen:  Soft, Obese AB, Active bowel sounds. No tenderness . Without guarding and Without rebound, No organomegaly appreciated. Rectal: Not evaluated Extremities:  without  edema. Neurologic:  Alert and  oriented x4;  No focal deficits.  Psych:  Cooperative. Normal mood and affect.   Vladimir Crofts, PA-C 12/31/21

## 2021-12-31 ENCOUNTER — Encounter: Payer: Self-pay | Admitting: Physician Assistant

## 2021-12-31 ENCOUNTER — Ambulatory Visit (INDEPENDENT_AMBULATORY_CARE_PROVIDER_SITE_OTHER): Payer: BC Managed Care – PPO | Admitting: Physician Assistant

## 2021-12-31 VITALS — BP 126/86 | HR 78 | Ht 67.0 in | Wt 222.5 lb

## 2021-12-31 DIAGNOSIS — K5730 Diverticulosis of large intestine without perforation or abscess without bleeding: Secondary | ICD-10-CM | POA: Diagnosis not present

## 2021-12-31 DIAGNOSIS — K625 Hemorrhage of anus and rectum: Secondary | ICD-10-CM

## 2021-12-31 DIAGNOSIS — K649 Unspecified hemorrhoids: Secondary | ICD-10-CM | POA: Diagnosis not present

## 2021-12-31 DIAGNOSIS — D120 Benign neoplasm of cecum: Secondary | ICD-10-CM

## 2021-12-31 MED ORDER — HYDROCORTISONE ACETATE 25 MG RE SUPP: 25.0000 mg | Freq: Two times a day (BID) | RECTAL | 0 refills | Status: DC

## 2021-12-31 NOTE — Patient Instructions (Signed)
If you are age 43 or younger, your body mass index should be between 19-25. Your Body mass index is 34.85 kg/m. If this is out of the aformentioned range listed, please consider follow up with your Primary Care Provider.  ________________________________________________________  The [facility] providers would like to encourage you to use [facility] to communicate with providers for non-urgent requests or questions.  Due to long hold times on the telephone, sending your provider a message by [facility] may be a faster and more efficient way to get a response.  Please allow 48 business hours for a response.  Please remember that this is for non-urgent requests.  _______________________________________________________  Dennis Bast have been scheduled for an appointment with  ______ at [facility]. Your appointment is on _____ at ______. Please arrive at ______ for registration. Make certain to bring a list of current medications, including any over the counter medications or vitamins. Also bring your co-pay if you have one as well as your insurance cards. [facility] is located at [address], [address]. Should you need to reschedule your appointment, please contact them at [phone].  We have sent the following medications to your pharmacy for you to pick up at your convenience: Anusol suppository  Please purchase the following medications over the counter and take as directed: Miralax   Toileting tips to help with your constipation - Drink at least 64-80 ounces of water/liquid per day. - Establish a time to try to move your bowels every day.  For many people, this is after a cup of coffee or after a meal such as breakfast. - Sit all of the way back on the toilet keeping your back fairly straight and while sitting up, try to rest the tops of your forearms on your upper thighs.   - Raising your feet with a step stool/squatty potty can be helpful to improve the angle that  allows your stool to pass through the rectum. - Relax the rectum feeling it bulge toward the toilet water.  If you feel your rectum raising toward your body, you are contracting rather than relaxing. - Breathe in and slowly exhale. "Belly breath" by expanding your belly towards your belly button. Keep belly expanded as you gently direct pressure down and back to the anus.  A low pitched GRRR sound can assist with increasing intra-abdominal pressure.  - Repeat 3-4 times. If unsuccessful, contract the pelvic floor to restore normal tone and get off the toilet.  Avoid excessive straining. - To reduce excessive wiping by teaching your anus to normally contract, place hands on outer aspect of knees and resist knee movement outward.  Hold 5-10 second then place hands just inside of knees and resist inward movement of knees.  Hold 5 seconds.  Repeat a few times each way.  Miralax is an osmotic laxative.  It only brings more water into the stool.  This is safe to take daily.  Can take up to 17 gram of miralax twice a day.  Mix with juice or coffee.  Start 1 capful at night for 3-4 days and reassess your response in 3-4 days.  You can increase and decrease the dose based on your response.  Remember, it can take up to 3-4 days to take effect OR for the effects to wear off.  I often pair this with benefiber in the morning to help assure the stool is not too loose.   Please do sitz baths- these can be found at the pharmacy.  It is a English as a second language teacher that is put in your toliet   Will send in hydrocoritsone suppository, cheapest with [employer] from [employer], [employer], [employer] or [employer] if your insurance does not pay for it. If the hemorrhoid suppository sent in is too expensive you can do this over the counter trick.  Apply a pea size amount of over the counter Anusol HC cream to the tip of an over the counter PrepH suppository and insert rectally once every night for at least 7 nights.  If this does not  improve there are procedures that can be done.   About Hemorrhoids  Hemorrhoids are swollen veins in the lower rectum and anus.  Also called piles, hemorrhoids are a common problem.  Hemorrhoids may be internal (inside the rectum) or external (around the anus).  Internal Hemorrhoids  Internal hemorrhoids are often painless, but they rarely cause bleeding.  The internal veins may stretch and fall down (prolapse) through the anus to the outside of the body.  The veins may then become irritated and painful.  External Hemorrhoids  External hemorrhoids can be easily seen or felt around the anal opening.  They are under the skin around the anus.  When the swollen veins are scratched or broken by straining, rubbing or wiping they sometimes bleed.  How Hemorrhoids Occur  Veins in the rectum and around the anus tend to swell under pressure.  Hemorrhoids can result from increased pressure in the veins of your anus or rectum.  Some sources of pressure are:  Straining to have a bowel movement because of constipation Waiting too long to have a bowel movement Coughing and sneezing often Sitting for extended periods of time, including on the toilet Diarrhea Obesity Trauma or injury to the anus Some liver diseases Stress Family history of hemorrhoids Pregnancy  Pregnant women should try to avoid becoming constipated, because they are more likely to have hemorrhoids during pregnancy.  In the last trimester of pregnancy, the enlarged uterus may press on blood vessels and causes hemorrhoids.  In addition, the strain of childbirth sometimes causes hemorrhoids after the birth.  Symptoms of Hemorrhoids  Some symptoms of hemorrhoids include: Swelling and/or a tender lump around the anus Itching, mild burning and bleeding around the anus Painful bowel movements with or without constipation Bright red blood covering the stool, on toilet paper or in the toilet bowel.   Symptoms usually go away within a  few days.  Always talk to your doctor about any bleeding to make sure it is not from some other causes.  Diagnosing and Treating Hemorrhoids  Diagnosis is made by an examination by your healthcare provider.  Special test can be performed by your doctor.    Most cases of hemorrhoids can be treated with: High-fiber diet: Eat more high-fiber foods, which help prevent constipation.  Ask for more detailed fiber information on types and sources of fiber from your healthcare provider. Fluids: Drink plenty of water.  This helps soften bowel movements so they are easier to pass. Sitz baths and cold packs: Sitting in lukewarm water two or three times a day for 15 minutes cleases the anal area and may relieve discomfort.  If the water is too hot, swelling around the anus will get worse.  Placing a cloth-covered ice pack on the anus for ten minutes four times a day can also help reduce selling.  Gently pushing a prolapsed hemorrhoid back inside after the bath or ice pack can be helpful. Medications: For mild discomfort, your  healthcare provider may suggest over-the-counter pain medication or prescribe a cream or ointment for topical use.  The cream may contain witch hazel, zinc oxide or petroleum jelly.  Medicated suppositories are also a treatment option.  Always consult your doctor before applying medications or creams. Procedures and surgeries: There are also a number of procedures and surgeries to shrink or remove hemorrhoids in more serious cases.  Talk to your physician about these options.  You can often prevent hemorrhoids or keep them from becoming worse by maintaining a healthy lifestyle.  Eat a fiber-rich diet of fruits, vegetables and whole grains.  Also, drink plenty of water and exercise regularly.   2007, [employer] Doc.19  Thank you for entrusting me with your care and choosing [facility].  Vicie Mutters, PA-C .

## 2022-01-08 ENCOUNTER — Other Ambulatory Visit: Payer: Self-pay | Admitting: Physician Assistant

## 2022-01-08 DIAGNOSIS — K649 Unspecified hemorrhoids: Secondary | ICD-10-CM

## 2022-01-25 NOTE — Progress Notes (Signed)
Agree with assessment/plan.  Raj Caroline Longie, MD [facility] [phone]  

## 2022-02-13 DIAGNOSIS — K648 Other hemorrhoids: Secondary | ICD-10-CM | POA: Diagnosis not present

## 2022-02-13 DIAGNOSIS — K644 Residual hemorrhoidal skin tags: Secondary | ICD-10-CM | POA: Diagnosis not present

## 2022-02-17 DIAGNOSIS — F5101 Primary insomnia: Secondary | ICD-10-CM | POA: Diagnosis not present

## 2022-03-20 NOTE — Progress Notes (Unsigned)
03/26/2022 FAWZI MELMAN [id number] 16-Apr-1981  Referring provider: Mackie Pai, PA-C Primary GI doctor: Dr. Lyndel Safe  ASSESSMENT AND PLAN:   Irritable bowel syndrome with constipation - Increase fiber/ water intake, decrease caffeine, increase activity level. Continue benefiber once a day Has tried miralax without significant benefit, some AB pain -- linzess samples given - instructions how to take given to patient in AVS - lifestyle changes discussed  Gastroesophageal reflux disease while on prilosec 20 mg daily for 1 year No alarm symptoms, will check CBC, CMET, celiac.  Negative H pylori breath test 04/2021 Lifestyle changes discussed, avoid NSAIDS, ETOH Will stop prilosec, change to pantoprazole twice a day Advised to stop eating so close to bed  Will get AB [place] to evaluate gallbladder/liver, etc.  If not improving will plan for EGD next OV  Hemorrhoids, unspecified hemorrhoid type Had follow up Dr. Dema Severin 02/13/2022 States have improved with fiber, has follow up in Dec.  Benign neoplasm of cecum 07/22/2021 colonoscopy with Dr. Lyndel Safe for rectal bleeding 65m hyperplastic polyp, sigmoid diverticulosis, external and internal hemorrhoids normal terminal ileum.  Recall 10 years.  Diverticulosis of colon without hemorrhage Will call if any symptoms. Add on fiber supplement, avoid NSAIDS, information given  History of Present Illness:  41y.o. male  with a past medical history of anxiety, insomnia, GERD history of gastric ulcers and others listed below, returns to clinic today for evaluation of rectal bleeding.  07/22/2021 colonoscopy with Dr. GLyndel Safefor rectal bleeding 651mhyperplastic polyp, sigmoid diverticulosis, external and internal hemorrhoids normal terminal ileum.  Recall 10 years. 12/31/2021 office visit for rectal bleeding, given hydrocortisone suppositories, discussed bowel habits, MiraLAX, fiber, referral placed to general surgery for evaluation of surgical  treatment for possible EUA/hemorrhoidectomy  He states he has reflux for the last year.  He is on omeprazole 20 mg once daily in the morning before food, has taken for the last year without help.  Nothing worse but reflux is worse in the morning, no nocturnal symptoms.  No dysphagia. He has had some intentional weight loss, about 8 lbs in 3 months  No melena, no hematochezia. No AB pain.  He has some intermittent periumbilical pain, not better after BM, last for 30 mins.  Denies nausea, vomiting.  He has BM daily, it is hard stools, some straining.  He is on benefiber daily without help.  H pylori breath test negative 04/22/2021  Wt Readings from Last 3 Encounters:  03/26/22 215 lb 12.8 oz (97.9 kg)  12/31/21 222 lb 8 oz (100.9 kg)  12/24/21 223 lb 12.8 oz (101.5 kg)   He  reports that he quit smoking about 16 years ago. His smoking use included cigarettes. He has never used smokeless tobacco. He reports that he does not currently use alcohol. He reports that he does not use drugs. His family history includes Alcohol abuse in his maternal grandfather; Arthritis in his father, maternal grandmother, mother, and paternal grandmother; Birth defects in his brother; Depression in his father; Diabetes in his father and mother; Hyperlipidemia in his mother; Hypertension in his father, maternal grandmother, and mother; Kidney disease in his father; Prostate cancer in his maternal grandfather; Throat cancer in his paternal aunt.   Current Medications:        Current Outpatient Medications (Other):    linaclotide (LINZESS) 145 MCG CAPS capsule, Take 1 capsule (145 mcg total) by mouth daily before breakfast.   pantoprazole (PROTONIX) 20 MG tablet, Take 1 tablet (20 mg total) by mouth  2 (two) times daily before a meal.   QUEtiapine (SEROQUEL) 50 MG tablet, Take 50-100 mg by mouth at bedtime.   clonazePAM (KLONOPIN) 1 MG tablet, Take 0.5-1 mg by mouth at bedtime. (Patient not taking: Reported on  12/31/2021)   famotidine (PEPCID) 20 MG tablet, Take 1 tablet (20 mg total) by mouth daily. (Patient not taking: Reported on 12/31/2021)   fluticasone (CUTIVATE) 0.05 % cream, Apply topically 2 (two) times daily. For 10 days apply to rectum area (Patient not taking: Reported on 03/26/2022)   fluticasone (CUTIVATE) 0.05 % cream, Apply topically 2 (two) times daily. Apply Per Rectum twice a day for 10 days (Patient not taking: Reported on 03/26/2022)   hydrocortisone (ANUCORT-HC) 25 MG suppository, Place 1 suppository (25 mg total) rectally 2 (two) times daily as needed for hemorrhoids or anal itching. (Patient not taking: Reported on 03/26/2022)   hydrOXYzine (ATARAX/VISTARIL) 25 MG tablet, TAKE 1 TABLET BY MOUTH AT BEDTIME FOR  INSOMNIA  OR  ANXIETY (Patient not taking: Reported on 12/31/2021)   pramoxine (PROCTOFOAM) 1 % foam, Place 1 application rectally 3 (three) times daily as needed for anal itching. (Patient not taking: Reported on 03/26/2022)  Surgical History:  He  has a past surgical history that includes Knee arthroscopy (Left) and Lumbar laminectomy/decompression microdiscectomy (Right, 09/11/2015).  Current Medications, Allergies, Past Medical History, Past Surgical History, Family History and Social History were reviewed in [employer] record.  Physical Exam: BP (!) 130/98   Pulse 75   Ht '5\' 9"'$  (1.753 m)   Wt 215 lb 12.8 oz (97.9 kg)   SpO2 98%   BMI 31.87 kg/m  General:   Pleasant, well developed male in no acute distress Heart : Regular rate and rhythm; no murmurs Pulm: Clear anteriorly; no wheezing Abdomen:  Soft, Obese AB, Active bowel sounds. mild tenderness in the lower abdomen. Without guarding and Without rebound, No organomegaly appreciated. Rectal: Not evaluated Extremities:  without  edema. Neurologic:  Alert and  oriented x4;  No focal deficits.  Psych:  Cooperative. Normal mood and affect.   Vladimir Crofts, PA-C 03/26/22

## 2022-03-26 ENCOUNTER — Ambulatory Visit: Payer: BC Managed Care – PPO | Admitting: Medical

## 2022-03-26 ENCOUNTER — Ambulatory Visit (INDEPENDENT_AMBULATORY_CARE_PROVIDER_SITE_OTHER): Payer: BC Managed Care – PPO | Admitting: Physician Assistant

## 2022-03-26 ENCOUNTER — Other Ambulatory Visit (INDEPENDENT_AMBULATORY_CARE_PROVIDER_SITE_OTHER): Payer: BC Managed Care – PPO

## 2022-03-26 VITALS — BP 130/98 | HR 75 | Ht 69.0 in | Wt 215.8 lb

## 2022-03-26 DIAGNOSIS — K649 Unspecified hemorrhoids: Secondary | ICD-10-CM

## 2022-03-26 DIAGNOSIS — K219 Gastro-esophageal reflux disease without esophagitis: Secondary | ICD-10-CM | POA: Diagnosis not present

## 2022-03-26 DIAGNOSIS — K581 Irritable bowel syndrome with constipation: Secondary | ICD-10-CM | POA: Diagnosis not present

## 2022-03-26 DIAGNOSIS — K5730 Diverticulosis of large intestine without perforation or abscess without bleeding: Secondary | ICD-10-CM

## 2022-03-26 DIAGNOSIS — D120 Benign neoplasm of cecum: Secondary | ICD-10-CM

## 2022-03-26 LAB — CBC WITH DIFFERENTIAL/PLATELET
Basophils Absolute: 0 10*3/uL (ref 0.0–0.1)
Basophils Relative: 0.5 % (ref 0.0–3.0)
Eosinophils Absolute: 0.3 10*3/uL (ref 0.0–0.7)
Eosinophils Relative: 4 % (ref 0.0–5.0)
HCT: 48.1 % (ref 39.0–52.0)
Hemoglobin: 15.9 g/dL (ref 13.0–17.0)
Lymphocytes Relative: 27.7 % (ref 12.0–46.0)
Lymphs Abs: 2.2 10*3/uL (ref 0.7–4.0)
MCHC: 33 g/dL (ref 30.0–36.0)
MCV: 91.9 fl (ref 78.0–100.0)
Monocytes Absolute: 0.5 10*3/uL (ref 0.1–1.0)
Monocytes Relative: 6.6 % (ref 3.0–12.0)
Neutro Abs: 5 10*3/uL (ref 1.4–7.7)
Neutrophils Relative %: 61.2 % (ref 43.0–77.0)
Platelets: 241 10*3/uL (ref 150.0–400.0)
RBC: 5.24 Mil/uL (ref 4.22–5.81)
RDW: 14.2 % (ref 11.5–15.5)
WBC: 8.1 10*3/uL (ref 4.0–10.5)

## 2022-03-26 LAB — COMPREHENSIVE METABOLIC PANEL
ALT: 18 U/L (ref 0–53)
AST: 16 U/L (ref 0–37)
Albumin: 4.5 g/dL (ref 3.5–5.2)
Alkaline Phosphatase: 76 U/L (ref 39–117)
BUN: 14 mg/dL (ref 6–23)
CO2: 27 mEq/L (ref 19–32)
Calcium: 9.2 mg/dL (ref 8.4–10.5)
Chloride: 105 mEq/L (ref 96–112)
Creatinine, Ser: 1.09 mg/dL (ref 0.40–1.50)
GFR: 83.41 mL/min (ref >60.00)
Glucose, Bld: 92 mg/dL (ref 70–99)
Potassium: 4.5 mEq/L (ref 3.5–5.1)
Sodium: 139 mEq/L (ref 135–145)
Total Bilirubin: 0.7 mg/dL (ref 0.2–1.2)
Total Protein: 7.1 g/dL (ref 6.0–8.3)

## 2022-03-26 MED ORDER — PANTOPRAZOLE SODIUM 20 MG PO TBEC: 20.0000 mg | DELAYED_RELEASE_TABLET | Freq: Two times a day (BID) | ORAL | 1 refills | Status: DC

## 2022-03-26 MED ORDER — LINACLOTIDE 145 MCG PO CAPS: 145.0000 ug | ORAL_CAPSULE | Freq: Every day | ORAL | 3 refills | Status: DC

## 2022-03-26 NOTE — Patient Instructions (Addendum)
Your provider has requested that you go to the basement level for lab work before leaving today. Press "B" on the elevator. The lab is located at the first door on the left as you exit the elevator.   Please take your proton pump inhibitor medication, pantoprazole 20 mg TWICE a day for 2-3 months, then can try once a day at night before dinner.   Please take this medication 30 minutes to 1 hour before meals- this makes it more effective.  Avoid spicy and acidic foods Avoid fatty foods Limit your intake of coffee, tea, alcohol, and carbonated drinks Work to maintain a healthy weight Keep the head of the bed elevated at least 3 inches with blocks or a wedge pillow if you are having any nighttime symptoms Stay upright for 2 hours after eating Avoid meals and snacks three to four hours before bedtime  Linzess given 145 mg samples and send in prescription *IBS-C patients may begin to experience relief from belly pain and overall abdominal symptoms (pain, discomfort, and bloating) in about 1 week,  with symptoms typically improving over 12 weeks.  Take at least 30 minutes before the first meal of the day on an empty stomach You can have a loose stool if you eat a high-fat breakfast. Give it at least 7 days, may have more bowel movements during that time.   The diarrhea should go away and you should start having normal, complete, full bowel movements.  It may be helpful to start treatment when you can be near the comfort of your own bathroom, such as a weekend.  After you are out we can send in a prescription if you did well, there is a prescription card  Toileting tips to help with your constipation - Drink at least 64-80 ounces of water/liquid per day. - Establish a time to try to move your bowels every day.  For many people, this is after a cup of coffee or after a meal such as breakfast. - Sit all of the way back on the toilet keeping your back fairly straight and while sitting up, try to  rest the tops of your forearms on your upper thighs.   - Raising your feet with a step stool/squatty potty can be helpful to improve the angle that allows your stool to pass through the rectum. - Relax the rectum feeling it bulge toward the toilet water.  If you feel your rectum raising toward your body, you are contracting rather than relaxing. - Breathe in and slowly exhale. "Belly breath" by expanding your belly towards your belly button. Keep belly expanded as you gently direct pressure down and back to the anus.  A low pitched GRRR sound can assist with increasing intra-abdominal pressure.  - Repeat 3-4 times. If unsuccessful, contract the pelvic floor to restore normal tone and get off the toilet.  Avoid excessive straining. - To reduce excessive wiping by teaching your anus to normally contract, place hands on outer aspect of knees and resist knee movement outward.  Hold 5-10 second then place hands just inside of knees and resist inward movement of knees.  Hold 5 seconds.  Repeat a few times each way.  Diverticulosis Diverticulosis is a condition that develops when small pouches (diverticula) form in the wall of the large intestine (colon). The colon is where water is absorbed and stool (feces) is formed. The pouches form when the inside layer of the colon pushes through weak spots in the outer layers of the colon. You  may have a few pouches or many of them. The pouches usually do not cause problems unless they become inflamed or infected. When this happens, the condition is called diverticulitis- this is left lower quadrant pain, diarrhea, fever, chills, nausea or vomiting.  If this occurs please call the office or go to the hospital. Sometimes these patches without inflammation can also have painless bleeding associated with them, if this happens please call the office or go to the hospital. Preventing constipation and increasing fiber can help reduce diverticula and prevent complications. Even  if you feel you have a high-fiber diet, suggest getting on Benefiber or Cirtracel 2 times daily.  You will be contacted by [facility] in the next 2 days to arrange a complete abdominal Ultrasound..  The number on your caller ID will be [phone], please answer when they call.  If you have not heard from them in 2 days please call [phone] to schedule.     I appreciate the opportunity to care for you. Vicie Mutters, PA-C

## 2022-03-27 ENCOUNTER — Ambulatory Visit: Payer: BC Managed Care – PPO | Admitting: Medical

## 2022-03-27 ENCOUNTER — Telehealth: Payer: Self-pay | Admitting: Physician Assistant

## 2022-03-27 VITALS — BP 130/80 | HR 75 | Resp 18 | Ht 69.0 in | Wt 218.0 lb

## 2022-03-27 DIAGNOSIS — Z0000 Encounter for general adult medical examination without abnormal findings: Secondary | ICD-10-CM | POA: Diagnosis not present

## 2022-03-27 DIAGNOSIS — E010 Iodine-deficiency related diffuse (endemic) goiter: Secondary | ICD-10-CM

## 2022-03-27 DIAGNOSIS — Z23 Encounter for immunization: Secondary | ICD-10-CM

## 2022-03-27 LAB — TISSUE TRANSGLUTAMINASE, IGA: (tTG) Ab, IgA: <1 U/mL

## 2022-03-27 LAB — IGA: Immunoglobulin A: 142 mg/dL (ref 47–310)

## 2022-03-27 NOTE — Telephone Encounter (Signed)
Inbound call from patient stating his prescriptions are too high. Please advise.

## 2022-03-27 NOTE — Progress Notes (Signed)
Subjective:    Patient ID: Wayne Miller, male    DOB: 11-20-1980, 42 y.o.   MRN: [id number]  HPI  Pt in for follow up.   He is not fasting today. Will do wellness exam. Known hyperlipidemia.   Pt states some exercise after work and a lot of walking at work. Pt states he is eating moderate healthy. No alcohol use and no smoking.  Got flu vaccine today.    Update on last visit below. Pt states he is still seeing psychiatrist for anxiety and insomnia. States he is doing well with seroquel.  Jerrye Bushy- symptoms resolved with better diet and wt loss. Glad to hear not requiring medication.  Pt had mild thyroid enlargment on [place] in past but no finding that med criteria for biopsy. Tsh and t4 were normal.   Review of Systems  Constitutional:  Negative for chills, fatigue and fever.  HENT:  Negative for congestion, ear discharge and ear pain.   Respiratory:  Negative for cough, chest tightness, shortness of breath and wheezing.   Cardiovascular:  Negative for chest pain and palpitations.  Gastrointestinal:  Negative for abdominal pain, anal bleeding, diarrhea, nausea and vomiting.  Genitourinary:  Negative for difficulty urinating, dysuria, flank pain, frequency, penile pain, scrotal swelling and urgency.  Musculoskeletal:  Negative for back pain, joint swelling and neck stiffness.  Skin:  Negative for rash.   Past Medical History:  Diagnosis Date   Blood in stool    GERD (gastroesophageal reflux disease)    Herniated nucleus pulposis of lumbosacral region    Multiple gastric ulcers      Social History   Socioeconomic History   Marital status: Married    Spouse name: Not on file   Number of children: 3   Years of education: Not on file   Highest education level: Not on file  Occupational History   Not on file  Tobacco Use   Smoking status: Former    Types: Cigarettes    Quit date: 08/29/2005    Years since quitting: 16.5   Smokeless tobacco: Never  Vaping Use    Vaping Use: Never used  Substance and Sexual Activity   Alcohol use: Not Currently    Comment: past -not now   Drug use: No   Sexual activity: Yes  Other Topics Concern   Not on file  Social History Narrative   ** Merged History Encounter **       Social Determinants of Health   Financial Resource Strain: Not on file  Food Insecurity: Not on file  Transportation Needs: Not on file  Physical Activity: Not on file  Stress: Not on file  Social Connections: Not on file  Intimate Partner Violence: Not on file    Past Surgical History:  Procedure Laterality Date   KNEE ARTHROSCOPY Left    left knee scope "tear repair"   LUMBAR LAMINECTOMY/DECOMPRESSION MICRODISCECTOMY Right 09/11/2015   Procedure: MICRO LUMBAR DECOMPRESSION OF RIGHT L4-5 AND L5-S1 AND RIGHT FORAMINOTOMY L5 (2 LEVELS);  Surgeon: Susa Day, MD;  Location: [facility];  Service: Orthopedics;  Laterality: Right;    Family History  Problem Relation Age of Onset   Arthritis Mother    Diabetes Mother    Hyperlipidemia Mother    Hypertension Mother    Arthritis Father    Depression Father    Diabetes Father    Hypertension Father    Kidney disease Father    Birth defects Brother    Throat cancer Paternal  Aunt    Arthritis Maternal Grandmother    Hypertension Maternal Grandmother    Alcohol abuse Maternal Grandfather    Prostate cancer Maternal Grandfather    Arthritis Paternal Grandmother    Colon cancer Neg Hx    Rectal cancer Neg Hx    Stomach cancer Neg Hx    Esophageal cancer Neg Hx     Allergies  Allergen Reactions   Ibuprofen Other (See Comments)    G I upset    Current Outpatient Medications on File Prior to Visit  Medication Sig Dispense Refill   clonazePAM (KLONOPIN) 1 MG tablet Take 0.5-1 mg by mouth at bedtime. (Patient not taking: Reported on 12/31/2021)     famotidine (PEPCID) 20 MG tablet Take 1 tablet (20 mg total) by mouth daily. (Patient not taking: Reported on 12/31/2021) 30 tablet 3    fluticasone (CUTIVATE) 0.05 % cream Apply topically 2 (two) times daily. For 10 days apply to rectum area (Patient not taking: Reported on 03/26/2022) 30 g 2   fluticasone (CUTIVATE) 0.05 % cream Apply topically 2 (two) times daily. Apply Per Rectum twice a day for 10 days (Patient not taking: Reported on 03/26/2022) 30 g 2   hydrocortisone (ANUCORT-HC) 25 MG suppository Place 1 suppository (25 mg total) rectally 2 (two) times daily as needed for hemorrhoids or anal itching. (Patient not taking: Reported on 03/26/2022) 12 suppository 0   hydrOXYzine (ATARAX/VISTARIL) 25 MG tablet TAKE 1 TABLET BY MOUTH AT BEDTIME FOR  INSOMNIA  OR  ANXIETY (Patient not taking: Reported on 12/31/2021) 30 tablet 3   linaclotide (LINZESS) 145 MCG CAPS capsule Take 1 capsule (145 mcg total) by mouth daily before breakfast. 90 capsule 3   pantoprazole (PROTONIX) 20 MG tablet Take 1 tablet (20 mg total) by mouth 2 (two) times daily before a meal. 180 tablet 1   pramoxine (PROCTOFOAM) 1 % foam Place 1 application rectally 3 (three) times daily as needed for anal itching. (Patient not taking: Reported on 03/26/2022) 15 g 0   QUEtiapine (SEROQUEL) 50 MG tablet Take 50-100 mg by mouth at bedtime.     No current facility-administered medications on file prior to visit.    BP 130/80   Pulse 75   Resp 18   Ht '5\' 9"'$  (1.753 m)   Wt 218 lb (98.9 kg)   SpO2 98%   BMI 32.19 kg/m        Objective:   Physical Exam  General Mental Status- Alert. General Appearance- Not in acute distress.   Skin General: Color- Normal Color. Moisture- Normal Moisture.  Neck Carotid Arteries- Normal color. Moisture- Normal Moisture. No carotid bruits. No JVD.  Chest and Lung Exam Auscultation: Breath Sounds:-Normal.  Cardiovascular Auscultation:Rythm- Regular. Murmurs & Other Heart Sounds:Auscultation of the heart reveals- No Murmurs.  Abdomen Inspection:-Inspeection Normal. Palpation/Percussion:Note:No mass. Palpation and  Percussion of the abdomen reveal- Non Tender, Non Distended + BS, no rebound or guarding.   Neurologic Cranial Nerve exam:- CN III-XII intact(No nystagmus), symmetric smile. Strength:- 5/5 equal and symmetric strength both upper and lower extremities.       Assessment & Plan:   Patient Instructions  For you wellness exam today I have ordered cbc, cmp and lipid panel. Labs marked future. Get scheduled for early am fasting.  Vaccine given today.  Recommend exercise and healthy diet.  We will let you know lab results as they come in.  Follow up date appointment will be determined after lab review.    Continue current meds  for anxiety, insomnia and gerd.        Mackie Pai, PA-C

## 2022-03-27 NOTE — Addendum Note (Signed)
Addended by: Jeronimo Greaves on: 03/27/2022 04:16 PM   Modules accepted: Orders

## 2022-03-27 NOTE — Telephone Encounter (Signed)
I called the [facility] on [address] and they told me that his Linzess and his pantoprazole both need a prior authorization. Out PA team will work on that. I called and told Wayne Miller that we are working on it.

## 2022-03-27 NOTE — Patient Instructions (Addendum)
For you wellness exam today I have ordered cbc, cmp and lipid panel. Labs marked future. Get scheduled for early am fasting.  Vaccine given today.  Recommend exercise and healthy diet.  We will let you know lab results as they come in.  Follow up date appointment will be determined after lab review.    Continue current meds for anxiety, insomnia and gerd.   Preventive Care 80-43 Years Old, Male Preventive care refers to lifestyle choices and visits with your health care provider that can promote health and wellness. Preventive care visits are also called wellness exams. What can I expect for my preventive care visit? Counseling During your preventive care visit, your health care provider may ask about your: Medical history, including: Past medical problems. Family medical history. Current health, including: Emotional well-being. Home life and relationship well-being. Sexual activity. Lifestyle, including: Alcohol, nicotine or tobacco, and drug use. Access to firearms. Diet, exercise, and sleep habits. Safety issues such as seatbelt and bike helmet use. Sunscreen use. Work and work Statistician. Physical exam Your health care provider will check your: Height and weight. These may be used to calculate your BMI (body mass index). BMI is a measurement that tells if you are at a healthy weight. Waist circumference. This measures the distance around your waistline. This measurement also tells if you are at a healthy weight and may help predict your risk of certain diseases, such as type 2 diabetes and high blood pressure. Heart rate and blood pressure. Body temperature. Skin for abnormal spots. What immunizations do I need?  Vaccines are usually given at various ages, according to a schedule. Your health care provider will recommend vaccines for you based on your age, medical history, and lifestyle or other factors, such as travel or where you work. What tests do I  need? Screening Your health care provider may recommend screening tests for certain conditions. This may include: Lipid and cholesterol levels. Diabetes screening. This is done by checking your blood sugar (glucose) after you have not eaten for a while (fasting). Hepatitis B test. Hepatitis C test. HIV (human immunodeficiency virus) test. STI (sexually transmitted infection) testing, if you are at risk. Lung cancer screening. Prostate cancer screening. Colorectal cancer screening. Talk with your health care provider about your test results, treatment options, and if necessary, the need for more tests. Follow these instructions at home: Eating and drinking  Eat a diet that includes fresh fruits and vegetables, whole grains, lean protein, and low-fat dairy products. Take vitamin and mineral supplements as recommended by your health care provider. Do not drink alcohol if your health care provider tells you not to drink. If you drink alcohol: Limit how much you have to 0-2 drinks a day. Know how much alcohol is in your drink. In the [place] one drink equals one 12 oz bottle of beer (355 mL), one 5 oz glass of wine (148 mL), or one 1 oz glass of hard liquor (44 mL). Lifestyle Brush your teeth every morning and night with fluoride toothpaste. Floss one time each day. Exercise for at least 30 minutes 5 or more days each week. Do not use any products that contain nicotine or tobacco. These products include cigarettes, chewing tobacco, and vaping devices, such as e-cigarettes. If you need help quitting, ask your health care provider. Do not use drugs. If you are sexually active, practice safe sex. Use a condom or other form of protection to prevent STIs. Take aspirin only as told by your health care provider.  Make sure that you understand how much to take and what form to take. Work with your health care provider to find out whether it is safe and beneficial for you to take aspirin daily. Find  healthy ways to manage stress, such as: Meditation, yoga, or listening to music. Journaling. Talking to a trusted person. Spending time with friends and family. Minimize exposure to UV radiation to reduce your risk of skin cancer. Safety Always wear your seat belt while driving or riding in a vehicle. Do not drive: If you have been drinking alcohol. Do not ride with someone who has been drinking. When you are tired or distracted. While texting. If you have been using any mind-altering substances or drugs. Wear a helmet and other protective equipment during sports activities. If you have firearms in your house, make sure you follow all gun safety procedures. What's next? Go to your health care provider once a year for an annual wellness visit. Ask your health care provider how often you should have your eyes and teeth checked. Stay up to date on all vaccines. This information is not intended to replace advice given to you by your health care provider. Make sure you discuss any questions you have with your health care provider. Document Revised: 11/20/2020 Document Reviewed: 11/20/2020 Elsevier Patient Education  [place].

## 2022-03-28 ENCOUNTER — Ambulatory Visit (HOSPITAL_COMMUNITY)
Admission: RE | Admit: 2022-03-28 | Discharge: 2022-03-28 | Disposition: A | Discharge status: Home / Self Care | Payer: BC Managed Care – PPO | Source: Ambulatory Visit | Attending: Physician Assistant | Admitting: Physician Assistant

## 2022-03-28 DIAGNOSIS — K219 Gastro-esophageal reflux disease without esophagitis: Secondary | ICD-10-CM | POA: Diagnosis not present

## 2022-03-28 DIAGNOSIS — K760 Fatty (change of) liver, not elsewhere classified: Secondary | ICD-10-CM | POA: Diagnosis not present

## 2022-03-29 NOTE — Progress Notes (Signed)
Agree with assessment/plan.  Raj Sabien Umland, MD [facility] [phone]  

## 2022-03-30 ENCOUNTER — Other Ambulatory Visit (HOSPITAL_COMMUNITY): Payer: Self-pay

## 2022-03-31 ENCOUNTER — Telehealth: Payer: Self-pay

## 2022-03-31 ENCOUNTER — Other Ambulatory Visit (HOSPITAL_COMMUNITY): Payer: Self-pay

## 2022-03-31 ENCOUNTER — Other Ambulatory Visit: Payer: Self-pay

## 2022-03-31 MED ORDER — TRULANCE 3 MG PO TABS: 3.0000 mg | ORAL_TABLET | Freq: Every day | ORAL | 0 refills | Status: AC

## 2022-03-31 NOTE — Telephone Encounter (Signed)
Pharmacy Patient Advocate Encounter  Received notification from [facility] that the request for prior authorization for Linzess 145 has been denied due to  .     This determination is currently being reviewed for appeal.    Joneen Boers, [place] Patient Advocate Specialist [place] Patient Advocate Team Direct Number: [phone] Fax: [phone]

## 2022-03-31 NOTE — Telephone Encounter (Signed)
Patient Advocate Encounter   Received notification from [facility] that prior authorization for Linzess 145 is required.   PA submitted on 03/31/2022 Key [id number] Status is pending       Joneen Boers, [place] Patient Advocate Specialist [place] Patient Advocate Team Direct Number: [phone] Fax: [phone]

## 2022-03-31 NOTE — Telephone Encounter (Signed)
Yes, can try the trulance first, once a day.  Thanks!

## 2022-03-31 NOTE — Telephone Encounter (Signed)
Patient Advocate Encounter  Prior Authorization for Pantoprazole '20mg'$  has been approved.    PA# [id number] Effective dates: 03/31/2022 through 03/30/2023   Joneen Boers, [place] Patient Advocate Specialist [place] Patient Advocate Team Direct Number: [phone] Fax: [phone]

## 2022-04-01 ENCOUNTER — Other Ambulatory Visit (HOSPITAL_COMMUNITY): Payer: Self-pay

## 2022-04-01 ENCOUNTER — Telehealth: Payer: Self-pay

## 2022-04-01 NOTE — Telephone Encounter (Signed)
Pharmacy Patient Advocate Encounter  Prior Authorization for Trulance has been approved.    PA# BCBS Effective dates: 04/01/2022 through 03/31/2023    Patient Advocate Encounter   Received notification from [facility] that prior authorization for Trulance is required.   PA submitted on 04/01/2022 Key [id number] Status is pending       Joneen Boers, [place] Patient Advocate Specialist [place] Patient Advocate Team Direct Number: [phone] Fax: [phone]

## 2022-04-03 ENCOUNTER — Other Ambulatory Visit (INDEPENDENT_AMBULATORY_CARE_PROVIDER_SITE_OTHER): Payer: BC Managed Care – PPO

## 2022-04-03 ENCOUNTER — Ambulatory Visit (INDEPENDENT_AMBULATORY_CARE_PROVIDER_SITE_OTHER): Payer: BC Managed Care – PPO | Admitting: Medical

## 2022-04-03 DIAGNOSIS — Z0000 Encounter for general adult medical examination without abnormal findings: Secondary | ICD-10-CM | POA: Diagnosis not present

## 2022-04-03 DIAGNOSIS — E010 Iodine-deficiency related diffuse (endemic) goiter: Secondary | ICD-10-CM

## 2022-04-03 LAB — CBC WITH DIFFERENTIAL/PLATELET
Basophils Absolute: 0 10*3/uL (ref 0.0–0.1)
Basophils Relative: 0.6 % (ref 0.0–3.0)
Eosinophils Absolute: 0.2 10*3/uL (ref 0.0–0.7)
Eosinophils Relative: 4.2 % (ref 0.0–5.0)
HCT: 45.3 % (ref 39.0–52.0)
Hemoglobin: 14.9 g/dL (ref 13.0–17.0)
Lymphocytes Relative: 30.2 % (ref 12.0–46.0)
Lymphs Abs: 1.8 10*3/uL (ref 0.7–4.0)
MCHC: 33 g/dL (ref 30.0–36.0)
MCV: 92 fl (ref 78.0–100.0)
Monocytes Absolute: 0.4 10*3/uL (ref 0.1–1.0)
Monocytes Relative: 7.5 % (ref 3.0–12.0)
Neutro Abs: 3.4 10*3/uL (ref 1.4–7.7)
Neutrophils Relative %: 57.5 % (ref 43.0–77.0)
Platelets: 234 10*3/uL (ref 150.0–400.0)
RBC: 4.92 Mil/uL (ref 4.22–5.81)
RDW: 14.3 % (ref 11.5–15.5)
WBC: 5.9 10*3/uL (ref 4.0–10.5)

## 2022-04-03 LAB — COMPREHENSIVE METABOLIC PANEL
ALT: 14 U/L (ref 0–53)
AST: 13 U/L (ref 0–37)
Albumin: 4.2 g/dL (ref 3.5–5.2)
Alkaline Phosphatase: 74 U/L (ref 39–117)
BUN: 12 mg/dL (ref 6–23)
CO2: 27 mEq/L (ref 19–32)
Calcium: 8.8 mg/dL (ref 8.4–10.5)
Chloride: 107 mEq/L (ref 96–112)
Creatinine, Ser: 0.94 mg/dL (ref 0.40–1.50)
GFR: 99.61 mL/min (ref >60.00)
Glucose, Bld: 81 mg/dL (ref 70–99)
Potassium: 4.3 mEq/L (ref 3.5–5.1)
Sodium: 140 mEq/L (ref 135–145)
Total Bilirubin: 0.6 mg/dL (ref 0.2–1.2)
Total Protein: 6.2 g/dL (ref 6.0–8.3)

## 2022-04-03 LAB — LIPID PANEL
Cholesterol: 149 mg/dL (ref 0–200)
HDL: 36.2 mg/dL — ABNORMAL LOW (ref >39.00)
LDL Cholesterol: 79 mg/dL (ref 0–99)
NonHDL: 112.31
Total CHOL/HDL Ratio: 4
Triglycerides: 165 mg/dL — ABNORMAL HIGH (ref 0.0–149.0)
VLDL: 33 mg/dL (ref 0.0–40.0)

## 2022-04-03 LAB — T4, FREE: Free T4: 0.75 ng/dL (ref 0.60–1.60)

## 2022-04-03 LAB — TSH: TSH: 0.56 u[IU]/mL (ref 0.35–5.50)

## 2022-04-03 NOTE — Patient Instructions (Signed)
No office charge today. Will just talk with lab and  make sure they do labs.

## 2022-04-03 NOTE — Progress Notes (Signed)
   Subjective:    Patient ID: Wayne Miller, male    DOB: 05-Jan-1981, 41 y.o.   MRN: [id number]  HPI  Pt in for follow up. He came in for wellness. He was not fasting. I advised to do fasting future labs. He accidentlly got scheduled for office visit?   His chroinc conditions controlled.     Review of Systems     Objective:   Physical Exam        Assessment & Plan:

## 2022-04-06 NOTE — Telephone Encounter (Signed)
PA team got pantoprazole approved and the Linzess was denied so they got Trulance approved.

## 2022-04-21 DIAGNOSIS — F5101 Primary insomnia: Secondary | ICD-10-CM | POA: Diagnosis not present

## 2022-05-19 DIAGNOSIS — K644 Residual hemorrhoidal skin tags: Secondary | ICD-10-CM | POA: Diagnosis not present

## 2022-05-19 DIAGNOSIS — K648 Other hemorrhoids: Secondary | ICD-10-CM | POA: Diagnosis not present

## 2022-05-22 ENCOUNTER — Ambulatory Visit: Payer: BC Managed Care – PPO | Admitting: Gastroenterology

## 2022-07-07 ENCOUNTER — Encounter: Payer: Self-pay | Admitting: Gastroenterology

## 2022-07-07 ENCOUNTER — Ambulatory Visit: Payer: BC Managed Care – PPO | Admitting: Gastroenterology

## 2022-07-07 VITALS — BP 110/72 | HR 64 | Ht 68.0 in | Wt 218.0 lb

## 2022-07-07 DIAGNOSIS — K581 Irritable bowel syndrome with constipation: Secondary | ICD-10-CM

## 2022-07-07 DIAGNOSIS — R1013 Epigastric pain: Secondary | ICD-10-CM

## 2022-07-07 DIAGNOSIS — K219 Gastro-esophageal reflux disease without esophagitis: Secondary | ICD-10-CM | POA: Diagnosis not present

## 2022-07-07 MED ORDER — PANTOPRAZOLE SODIUM 40 MG PO TBEC: 40.0000 mg | DELAYED_RELEASE_TABLET | Freq: Every day | ORAL | 3 refills | Status: DC

## 2022-07-07 NOTE — Progress Notes (Signed)
Chief Complaint: For FU  Referring Provider:  Mackie Pai, PA-C      ASSESSMENT AND PLAN;   #1. GERD/epi pain. Neg [place] except for fatty liver 03/2022. Nl CBC, CMP, lipase  #2. IBS-C. Neg colon 07/2021 except for small hyperplastic polyp.   Plan: -Increased protonix '40mg'$  po QD #90, 4RF -EGD -Miralax 17g po QD with 8oz water -Continue reduce to weight.  Proceed with EGD. I have discussed the risks and benefits. The risks including rare risk of perforation, bleeding, missed UGI neoplasms, risks of anesthesia/sedation. Alternatives were given. Patient is aware and agrees to proceed. All the questions were answered. This will be scheduled in upcoming days. Consent forms were given for review. HPI:    Wayne Miller is a 42 y.o. male  history of anxiety, insomnia, GERD history of gastric ulcers   For FU Still with postprandial epigastric pain and burning, somewhat better with the Protonix 20 mg p.o. daily.  No odynophagia or dysphagia Underwent ultrasound Abdo which was negative except for fatty liver.  Normal gallbladder Normal CBC, CMP and lipase No nonsteroidals.  Okay to proceed with EGD  Constipation is better.  Still having bowel movements 1 in 2-3 days.  Has not been taking MiraLAX every day.  Rectal bleeding is only on the tissue.  He has appointment with Dr. Dema Severin regarding that. Has been trying to reduce weight  Wt Readings from Last 3 Encounters:  07/07/22 218 lb (98.9 kg)  04/03/22 219 lb (99.3 kg)  03/27/22 218 lb (98.9 kg)    Past GI workup: Colonoscopy 07/22/2021 -Small hyperplastic polyp -Mild div -Internal hemorrhoids -Otherwise normal.  Repeat in 10 years   [place] Abdo 03/29/2022: fatty liver.  Normal gallbladder.  Past Medical History:  Diagnosis Date   Blood in stool    GERD (gastroesophageal reflux disease)    Herniated nucleus pulposis of lumbosacral region    Multiple gastric ulcers     Past Surgical History:  Procedure Laterality Date    KNEE ARTHROSCOPY Left    left knee scope "tear repair"   LUMBAR LAMINECTOMY/DECOMPRESSION MICRODISCECTOMY Right 09/11/2015   Procedure: MICRO LUMBAR DECOMPRESSION OF RIGHT L4-5 AND L5-S1 AND RIGHT FORAMINOTOMY L5 (2 LEVELS);  Surgeon: Susa Day, MD;  Location: [facility];  Service: Orthopedics;  Laterality: Right;    Family History  Problem Relation Age of Onset   Arthritis Mother    Diabetes Mother    Hyperlipidemia Mother    Hypertension Mother    Arthritis Father    Depression Father    Diabetes Father    Hypertension Father    Kidney disease Father    Birth defects Brother    Throat cancer Paternal Aunt    Arthritis Maternal Grandmother    Hypertension Maternal Grandmother    Alcohol abuse Maternal Grandfather    Prostate cancer Maternal Grandfather    Arthritis Paternal Grandmother    Colon cancer Neg Hx    Rectal cancer Neg Hx    Stomach cancer Neg Hx    Esophageal cancer Neg Hx     Social History   Tobacco Use   Smoking status: Former    Types: Cigarettes    Quit date: 08/29/2005    Years since quitting: 16.8   Smokeless tobacco: Never  Vaping Use   Vaping Use: Never used  Substance Use Topics   Alcohol use: Not Currently    Comment: past -not now   Drug use: No    Current Outpatient Medications  Medication Sig Dispense Refill   clonazePAM (KLONOPIN) 1 MG tablet Take 0.5-1 mg by mouth at bedtime.     fluticasone (CUTIVATE) 0.05 % cream Apply topically 2 (two) times daily. For 10 days apply to rectum area (Patient taking differently: Apply topically 2 (two) times daily as needed. For 10 days apply to rectum area) 30 g 2   pantoprazole (PROTONIX) 20 MG tablet Take 1 tablet (20 mg total) by mouth 2 (two) times daily before a meal. (Patient taking differently: Take 20 mg by mouth daily.) 180 tablet 1   famotidine (PEPCID) 20 MG tablet Take 1 tablet (20 mg total) by mouth daily. (Patient not taking: Reported on 07/07/2022) 30 tablet 3   hydrocortisone (ANUCORT-HC)  25 MG suppository Place 1 suppository (25 mg total) rectally 2 (two) times daily as needed for hemorrhoids or anal itching. (Patient not taking: Reported on 07/07/2022) 12 suppository 0   hydrOXYzine (ATARAX/VISTARIL) 25 MG tablet TAKE 1 TABLET BY MOUTH AT BEDTIME FOR  INSOMNIA  OR  ANXIETY (Patient not taking: Reported on 07/07/2022) 30 tablet 3   pramoxine (PROCTOFOAM) 1 % foam Place 1 application rectally 3 (three) times daily as needed for anal itching. (Patient not taking: Reported on 07/07/2022) 15 g 0   QUEtiapine (SEROQUEL) 50 MG tablet Take 50-100 mg by mouth at bedtime. (Patient not taking: Reported on 07/07/2022)     No current facility-administered medications for this visit.    Allergies  Allergen Reactions   Ibuprofen Other (See Comments)    G I upset    Review of Systems:  Constitutional: Denies fever, chills, diaphoresis, appetite change and fatigue.  HEENT: Denies photophobia, eye pain, redness, hearing loss, ear pain, congestion, sore throat, rhinorrhea, sneezing, mouth sores, neck pain, neck stiffness and tinnitus.   Respiratory: Denies SOB, DOE, cough, chest tightness,  and wheezing.   Cardiovascular: Denies chest pain, palpitations and leg swelling.  Genitourinary: Denies dysuria, urgency, frequency, hematuria, flank pain and difficulty urinating.  Musculoskeletal: Denies myalgias, back pain, joint swelling, arthralgias and gait problem.  Skin: No rash.  Neurological: Denies dizziness, seizures, syncope, weakness, light-headedness, numbness and headaches.  Hematological: Denies adenopathy. Easy bruising, personal or family bleeding history  Psychiatric/Behavioral: No anxiety or depression     Physical Exam:    BP 110/72   Pulse 64   Ht '5\' 8"'$  (1.727 m)   Wt 218 lb (98.9 kg)   SpO2 97%   BMI 33.15 kg/m  Wt Readings from Last 3 Encounters:  07/07/22 218 lb (98.9 kg)  04/03/22 219 lb (99.3 kg)  03/27/22 218 lb (98.9 kg)   Constitutional:  Well-developed, in no  acute distress. Psychiatric: Normal mood and affect. Behavior is normal. HEENT: Pupils normal.  Conjunctivae are normal. No scleral icterus. Cardiovascular: Normal rate, regular rhythm. No edema Pulmonary/chest: Effort normal and breath sounds normal. No wheezing, rales or rhonchi. Abdominal: Soft, nondistended. Mild epi tender. Bowel sounds active throughout. There are no masses palpable. No hepatomegaly. Rectal: Deferred Neurological: Alert and oriented to person place and time. Skin: Skin is warm and dry. No rashes noted.  Data Reviewed: I have personally reviewed following labs and imaging studies  CBC:    Latest Ref Rng & Units 04/03/2022    8:49 AM 03/26/2022    9:11 AM 04/22/2021    9:15 AM  CBC  WBC 4.0 - 10.5 K/uL 5.9  8.1  6.1   Hemoglobin 13.0 - 17.0 g/dL 14.9  15.9  14.7   Hematocrit 39.0 - 52.0 %  45.3  48.1  44.7   Platelets 150.0 - 400.0 K/uL 234.0  241.0  222.0     CMP:    Latest Ref Rng & Units 04/03/2022    8:49 AM 03/26/2022    9:11 AM 04/22/2021    9:15 AM  CMP  Glucose 70 - 99 mg/dL 81  92  92   BUN 6 - 23 mg/dL '12  14  15   '$ Creatinine 0.40 - 1.50 mg/dL 0.94  1.09  0.97   Sodium 135 - 145 mEq/L 140  139  142   Potassium 3.5 - 5.1 mEq/L 4.3  4.5  4.5   Chloride 96 - 112 mEq/L 107  105  109   CO2 19 - 32 mEq/L '27  27  27   '$ Calcium 8.4 - 10.5 mg/dL 8.8  9.2  8.8   Total Protein 6.0 - 8.3 g/dL 6.2  7.1  6.6   Total Bilirubin 0.2 - 1.2 mg/dL 0.6  0.7  0.5   Alkaline Phos 39 - 117 U/L 74  76  70   AST 0 - 37 U/L '13  16  16   '$ ALT 0 - 53 U/L '14  18  20         '$ Carmell Austria, MD 07/07/2022, 8:52 AM  Cc: Mackie Pai, PA-C

## 2022-07-07 NOTE — Patient Instructions (Addendum)
_______________________________________________________  If your blood pressure at your visit was 140/90 or greater, please contact your primary care physician to follow up on this.  _______________________________________________________  If you are age 44 or older, your body mass index should be between 23-30. Your Body mass index is 33.15 kg/m. If this is out of the aforementioned range listed, please consider follow up with your Primary Care Provider.  If you are age 43 or younger, your body mass index should be between 19-25. Your Body mass index is 33.15 kg/m. If this is out of the aformentioned range listed, please consider follow up with your Primary Care Provider.   ________________________________________________________  The [facility] providers would like to encourage you to use [facility] to communicate with providers for non-urgent requests or questions.  Due to long hold times on the telephone, sending your provider a message by [facility] may be a faster and more efficient way to get a response.  Please allow 48 business hours for a response.  Please remember that this is for non-urgent requests.  _______________________________________________________  We have sent the following medications to your pharmacy for you to pick up at your convenience: Protonix '40mg'$  daily  Please purchase the following medications over the counter and take as directed: Miralax 17g daily in [place] of water  Continue to reduce weight  You have been scheduled for an endoscopy. Please follow written instructions given to you at your visit today. If you use inhalers (even only as needed), please bring them with you on the day of your procedure.  Thank you,  Dr. Jackquline Denmark

## 2022-07-15 ENCOUNTER — Ambulatory Visit: Payer: Self-pay | Admitting: Surgery

## 2022-07-15 NOTE — Progress Notes (Signed)
Sent message, via epic in basket, requesting orders in epic from surgeon.  

## 2022-07-16 NOTE — Patient Instructions (Signed)
DEBIDO AL COVID-19 SLO SE PERMITEN DOS VISITANTES (de 16 aos en adelante)  PARA QUE VENGAN CON USTED Y SE QUEDEN EN LA SALA DE ESPERA SOLAMENTE DURANTE EL PRE OP Y EL PROCEDIMIENTO.   **NO SE PERMITEN VISITAS EN EL REA DE CORTA ESTADA NI EN LA SALA DE RECUPERACIN!!**  SI VA A SER INGRESADO(A) AL HOSPITAL SLO SE LE PERMITEN CUATRO PERSONAS DE APOYO DURANTE LAS HORAS DE VISITA (7 AM -8PM)   La(s) persona(s) de apoyo debe(n) pasar nuestra evaluacin, entrar y salir con gel y Teaching laboratory technician en todo momento, incluso en la habitacin del [place]. Los pacientes tambin deben usar una mscara cuando el personal o su persona de apoyo estn en la habitacin. Los visitantes DEBEN LLEVAR ETIQUETA DE VISITANTE DE UNA MANERA VISIBLE. Un visitante adulto International aid/development worker con usted durante la noche y DEBE estar en la habitacin a las 8 P.M.     Su procedimiento est programado en: 07/27/22   Presntese a la entrada [place]     Presntese a admisiones por la maana a las: 5:15 AM.   Llame a este nmero si tiene [employer] maana de la ciruga al [phone]   No consuma alimentos: Despus de la medianoche   Despus de la medianoche puede tomar los siguientes lquidos [employer] la(s) : 4:30 AM DEL DA DE LA CIRUGA  Agua Caf negro (con azcar, SIN [place], NI CREMA)  T normal y descafeinado (con azcar, SIN LECHE, NI CREMA)                              Gelatina normal (NO ROJA)                                           Helados de frutas (sin pulpa. NO DE COLOR ROJO)                                     Helados de hielo (NO ROJO)                                                                  Jugo: de manzana, uva Silvis, arndano BLANCO Bebidas deportivas como Gatorade (NO ROJAS)  SIGA LA PREPARACIN INTESTINAL Y CUALQUIER INSTRUCCIN ADICIONAL PREOPERATORIA QUE HAYA RECIBIDO DEL OFICINA DE DU CIRUJANO!!!   La higiene bucal tambin es importante para reducir el  riesgo de infeccin.                                   Recuerde - LVESE LOS DIENTES EN LA MAANA DE LA CIRUGA CON SU PASTA DENTAL HABITUAL   NO fume despus de la medianoche   [employer] en la maana de la ciruga con UN SORBO DE AGUA: Gearldine Bienenstock.                              No debe trae  ningn metal en el cuerpo, incluyendo pinzas para el cabello, joyas, ni aretes/pendientes             No  lociones/cremas, polvos, perfumes/colonias o desodorante              Los hombres pueden afeitarse la cara y el cuello.   No traiga objetos de valor al hospital. [place] NO SE HACE RESPONSABLE DE LOS OBJETOS DE VALOR.   Los contactos, las dentaduras o los puentes no se pueden usar durante la [place].   Sheela Stack una bolsa pequea para la noche el da de la [place].   NO TRAIGA AL HOSPITAL LOS MEDICAMENTOS QUE TOMA EN CASA . [place] EN SU LISTA [place]!    Los pacientes dados de alta el mismo da de la ciruga no podrn Air cabin crew a casa.  Es NECESARIO que alguien se quede con usted durante las primeras 24 horas despus de la anestesia.   Instrucciones especiales: Ane Payment copia de sus documentos de poder notarial y testamento vital el da de su ciruga si no los ha escaneado antes.              Por favor, lea las siguientes hojas informativas que le dieron: SI TIENE PREGUNTAS SOBRE SUS INSTRUCCIONES PREOPERATORIAS POR FAVOR LLAME AL [employer]                                            Preparing for Surgery  Debido a que la piel no est esterilizada, sta necesita estar lo ms libre de grmenes como sea posible.  Usted puede reducir el nmero de grmenes en la piel lavndose con el jabn de CHG (Chlorahexidine gluconate) antes de la ciruga.  El CHG es un jabn antisptico el cual mata los grmenes y se une a la piel para continuar matando los  grmenes incluso hasta despus de lavarse. POR FAVOR NO LO USE SI USTED TIENE ALERGIAS AL CHG.  SI LA PIEL SE IRRITA, DEJE DE USAR EL CHG.  NO SE RASURE DURANTE AL MENOS 12 HORAS ANTES DE LA PRIMERA DUCHA CON EL CHG. Siga estas instrucciones cuidadosamente:  Dchese la noche anterior a la [place] y de nuevo en la maana de la [place]. Si decide lavarse el cabello, lvelo con su champ normal primero. Enjuague el cabello y el cuerpo para quitarse el [place]. Use el CHG como lo hara con cualquier otro jabn lquido, usando una toallita o esponja vegetal o exfoliante. Aplique el CHG al cuerpo solamente DEL CUELLO PARA ABAJO.  No lo use cerca de los ojos o los genitales. No se lave con su jabn normal despus de usar el CHG. Squese con una toalla limpia. Espere hasta la maana siguiente para aplicarse desodorantes, lociones, excepto en el da de la [place], NO SE APLIQUE LOCIONES. Use pijamas limpias o una bata. Coloque sbanas limpias en su cama la noche de su primera ducha - no duerma con mascotas. 10.  Use ropa limpia al venir al hospital.

## 2022-07-17 ENCOUNTER — Encounter (HOSPITAL_COMMUNITY): Payer: Self-pay

## 2022-07-17 ENCOUNTER — Other Ambulatory Visit: Payer: Self-pay

## 2022-07-17 ENCOUNTER — Encounter (HOSPITAL_COMMUNITY)
Admission: RE | Admit: 2022-07-17 | Discharge: 2022-07-17 | Disposition: A | Discharge status: Home / Self Care | Payer: BC Managed Care – PPO | Source: Ambulatory Visit | Attending: Surgery | Admitting: Surgery

## 2022-07-17 VITALS — BP 131/97 | HR 75 | Temp 98.3°F | Ht 68.0 in | Wt 211.0 lb

## 2022-07-17 DIAGNOSIS — Z01812 Encounter for preprocedural laboratory examination: Secondary | ICD-10-CM | POA: Insufficient documentation

## 2022-07-17 DIAGNOSIS — Z01818 Encounter for other preprocedural examination: Secondary | ICD-10-CM

## 2022-07-17 HISTORY — DX: Fatty (change of) liver, not elsewhere classified: K76.0

## 2022-07-17 LAB — CBC
HCT: 51.2 % (ref 39.0–52.0)
Hemoglobin: 16.6 g/dL (ref 13.0–17.0)
MCH: 30.8 pg (ref 26.0–34.0)
MCHC: 32.4 g/dL (ref 30.0–36.0)
MCV: 95 fL (ref 80.0–100.0)
Platelets: 282 10*3/uL (ref 150–400)
RBC: 5.39 MIL/uL (ref 4.22–5.81)
RDW: 13.1 % (ref 11.5–15.5)
WBC: 7.4 10*3/uL (ref 4.0–10.5)
nRBC: 0 % (ref 0.0–0.2)

## 2022-07-17 NOTE — Progress Notes (Signed)
For Short Stay: [facility] appointment date:  Bowel Prep reminder:   For Anesthesia: PCP - Percell Miller Saguier: PA-C Cardiologist - N/A  Chest x-ray -  EKG -  Stress Test -  ECHO -  Cardiac Cath -  Pacemaker/ICD device last checked: Pacemaker orders received: Device Rep notified:  Spinal Cord Stimulator:  Sleep Study -  CPAP -   Fasting Blood Sugar -  Checks Blood Sugar _____ times a day Date and result of last Hgb A1c-  Last dose of GLP1 agonist-  GLP1 instructions:   Last dose of SGLT-2 inhibitors-  SGLT-2 instructions:   Blood Thinner Instructions: Aspirin Instructions: Last Dose:  Activity level: Can go up a flight of stairs and activities of daily living without stopping and without chest pain and/or shortness of breath   Able to exercise without chest pain and/or shortness of breath  Anesthesia review:   Patient denies shortness of breath, fever, cough and chest pain at PAT appointment   Patient verbalized understanding of instructions that were given to them at the PAT appointment. Patient was also instructed that they will need to review over the PAT instructions again at home before surgery.

## 2022-07-22 ENCOUNTER — Encounter: Payer: BC Managed Care – PPO | Admitting: Gastroenterology

## 2022-07-26 ENCOUNTER — Encounter (HOSPITAL_COMMUNITY): Payer: Self-pay | Admitting: Surgery

## 2022-07-26 NOTE — Anesthesia Preprocedure Evaluation (Addendum)
Anesthesia Evaluation  Patient identified by MRN, date of birth, ID band Patient awake    Reviewed: Allergy & Precautions, NPO status , Patient's Chart, lab work & pertinent test results  Airway Mallampati: II       Dental no notable dental hx. (+) Teeth Intact   Pulmonary former smoker   Pulmonary exam normal breath sounds clear to auscultation       Cardiovascular negative cardio ROS Normal cardiovascular exam Rhythm:Regular Rate:Normal     Neuro/Psych   Anxiety     negative neurological ROS     GI/Hepatic Neg liver ROS, PUD,GERD  Medicated,,Hemorrhoids    Endo/Other  Obesity  Renal/GU negative Renal ROS  negative genitourinary   Musculoskeletal Hx/o Lumbar laminectomy and microdiscectomy   Abdominal  (+) + obese  Peds  Hematology negative hematology ROS (+)   Anesthesia Other Findings   Reproductive/Obstetrics                              Anesthesia Physical Anesthesia Plan  ASA: 2  Anesthesia Plan: General   Post-op Pain Management: Minimal or no pain anticipated, Tylenol PO (pre-op)* and Precedex   Induction: Intravenous and Cricoid pressure planned  PONV Risk Score and Plan: Treatment may vary due to age or medical condition, Midazolam, Dexamethasone and Ondansetron  Airway Management Planned: Oral ETT  Additional Equipment: None  Intra-op Plan:   Post-operative Plan: Extubation in OR  Informed Consent: I have reviewed the patients History and Physical, chart, labs and discussed the procedure including the risks, benefits and alternatives for the proposed anesthesia with the patient or authorized representative who has indicated his/her understanding and acceptance.     Dental advisory given  Plan Discussed with: Anesthesiologist and CRNA  Anesthesia Plan Comments:          Anesthesia Quick Evaluation

## 2022-07-27 ENCOUNTER — Other Ambulatory Visit: Payer: Self-pay

## 2022-07-27 ENCOUNTER — Encounter (HOSPITAL_COMMUNITY): Payer: Self-pay | Admitting: Surgery

## 2022-07-27 ENCOUNTER — Ambulatory Visit (HOSPITAL_COMMUNITY)
Admission: RE | Admit: 2022-07-27 | Discharge: 2022-07-27 | Disposition: A | Discharge status: Home / Self Care | Payer: BC Managed Care – PPO | Source: Ambulatory Visit | Attending: Surgery | Admitting: Surgery

## 2022-07-27 ENCOUNTER — Ambulatory Visit (HOSPITAL_COMMUNITY): Payer: BC Managed Care – PPO | Admitting: Anesthesiology

## 2022-07-27 ENCOUNTER — Encounter (HOSPITAL_COMMUNITY)
Admission: RE | Disposition: A | Discharge status: Home / Self Care | Payer: Self-pay | Source: Ambulatory Visit | Attending: Surgery

## 2022-07-27 DIAGNOSIS — K644 Residual hemorrhoidal skin tags: Secondary | ICD-10-CM | POA: Diagnosis not present

## 2022-07-27 DIAGNOSIS — Z87891 Personal history of nicotine dependence: Secondary | ICD-10-CM | POA: Diagnosis not present

## 2022-07-27 DIAGNOSIS — K648 Other hemorrhoids: Secondary | ICD-10-CM | POA: Insufficient documentation

## 2022-07-27 HISTORY — PX: EVALUATION UNDER ANESTHESIA WITH HEMORRHOIDECTOMY: SHX5624

## 2022-07-27 SURGERY — EXAM UNDER ANESTHESIA WITH HEMORRHOIDECTOMY
Anesthesia: General

## 2022-07-27 MED ORDER — BUPIVACAINE LIPOSOME 1.3 % IJ SUSP: 20.0000 mL | Freq: Once | INTRAMUSCULAR | Status: DC

## 2022-07-27 MED ORDER — LACTATED RINGERS IV SOLN: INTRAVENOUS | Status: DC

## 2022-07-27 MED ORDER — SUGAMMADEX SODIUM 200 MG/2ML IV SOLN
INTRAVENOUS | Status: DC | PRN
  Administered 2022-07-27: 200 mg via INTRAVENOUS

## 2022-07-27 MED ORDER — CHLORHEXIDINE GLUCONATE CLOTH 2 % EX PADS: 6.0000 | MEDICATED_PAD | Freq: Once | CUTANEOUS | Status: DC

## 2022-07-27 MED ORDER — DROPERIDOL 2.5 MG/ML IJ SOLN: 0.6250 mg | Freq: Once | INTRAMUSCULAR | Status: DC | PRN

## 2022-07-27 MED ORDER — ROCURONIUM BROMIDE 10 MG/ML (PF) SYRINGE
PREFILLED_SYRINGE | INTRAVENOUS | Status: DC | PRN
  Administered 2022-07-27: 60 mg via INTRAVENOUS

## 2022-07-27 MED ORDER — OXYCODONE HCL 5 MG PO TABS: 5.0000 mg | ORAL_TABLET | Freq: Once | ORAL | Status: DC | PRN

## 2022-07-27 MED ORDER — STERILE WATER FOR IRRIGATION IR SOLN
Status: DC | PRN
  Administered 2022-07-27: 1000 mL

## 2022-07-27 MED ORDER — FENTANYL CITRATE (PF) 100 MCG/2ML IJ SOLN
INTRAMUSCULAR | Status: AC
  Filled 2022-07-27: qty 2

## 2022-07-27 MED ORDER — FENTANYL CITRATE (PF) 100 MCG/2ML IJ SOLN
INTRAMUSCULAR | Status: DC | PRN
  Administered 2022-07-27: 50 ug via INTRAVENOUS
  Administered 2022-07-27: 100 ug via INTRAVENOUS
  Administered 2022-07-27 (×2): 25 ug via INTRAVENOUS

## 2022-07-27 MED ORDER — BUPIVACAINE HCL (PF) 0.25 % IJ SOLN
INTRAMUSCULAR | Status: AC
  Filled 2022-07-27: qty 30

## 2022-07-27 MED ORDER — ORAL CARE MOUTH RINSE: 15.0000 mL | Freq: Once | OROMUCOSAL | Status: AC

## 2022-07-27 MED ORDER — PROPOFOL 10 MG/ML IV BOLUS
INTRAVENOUS | Status: AC
  Filled 2022-07-27: qty 20

## 2022-07-27 MED ORDER — ROCURONIUM BROMIDE 10 MG/ML (PF) SYRINGE
PREFILLED_SYRINGE | INTRAVENOUS | Status: AC
  Filled 2022-07-27: qty 10

## 2022-07-27 MED ORDER — HYDROMORPHONE HCL 1 MG/ML IJ SOLN
0.2500 mg | INTRAMUSCULAR | Status: DC | PRN
  Administered 2022-07-27: 0.5 mg via INTRAVENOUS
  Administered 2022-07-27 (×2): 0.25 mg via INTRAVENOUS

## 2022-07-27 MED ORDER — DEXAMETHASONE SODIUM PHOSPHATE 10 MG/ML IJ SOLN
INTRAMUSCULAR | Status: AC
  Filled 2022-07-27: qty 1

## 2022-07-27 MED ORDER — ONDANSETRON HCL 4 MG/2ML IJ SOLN
INTRAMUSCULAR | Status: DC | PRN
  Administered 2022-07-27: 4 mg via INTRAVENOUS

## 2022-07-27 MED ORDER — LIDOCAINE HCL (PF) 2 % IJ SOLN
INTRAMUSCULAR | Status: AC
  Filled 2022-07-27: qty 5

## 2022-07-27 MED ORDER — CHLORHEXIDINE GLUCONATE 0.12 % MT SOLN
15.0000 mL | Freq: Once | OROMUCOSAL | Status: AC
  Administered 2022-07-27: 15 mL via OROMUCOSAL

## 2022-07-27 MED ORDER — PROPOFOL 10 MG/ML IV BOLUS
INTRAVENOUS | Status: DC | PRN
  Administered 2022-07-27: 180 mg via INTRAVENOUS

## 2022-07-27 MED ORDER — FLEET ENEMA 7-19 GM/118ML RE ENEM
1.0000 | ENEMA | Freq: Once | RECTAL | Status: DC
  Filled 2022-07-27: qty 1

## 2022-07-27 MED ORDER — CEFAZOLIN SODIUM-DEXTROSE 2-4 GM/100ML-% IV SOLN
2.0000 g | INTRAVENOUS | Status: AC
  Administered 2022-07-27: 2 g via INTRAVENOUS
  Filled 2022-07-27: qty 100

## 2022-07-27 MED ORDER — BUPIVACAINE LIPOSOME 1.3 % IJ SUSP
INTRAMUSCULAR | Status: DC | PRN
  Administered 2022-07-27: 50 mL

## 2022-07-27 MED ORDER — ONDANSETRON HCL 4 MG/2ML IJ SOLN: 4.0000 mg | Freq: Once | INTRAMUSCULAR | Status: DC | PRN

## 2022-07-27 MED ORDER — DEXAMETHASONE SODIUM PHOSPHATE 10 MG/ML IJ SOLN
INTRAMUSCULAR | Status: DC | PRN
  Administered 2022-07-27: 10 mg via INTRAVENOUS

## 2022-07-27 MED ORDER — ONDANSETRON HCL 4 MG/2ML IJ SOLN
INTRAMUSCULAR | Status: AC
  Filled 2022-07-27: qty 2

## 2022-07-27 MED ORDER — OXYCODONE HCL 5 MG/5ML PO SOLN: 5.0000 mg | Freq: Once | ORAL | Status: DC | PRN

## 2022-07-27 MED ORDER — ACETAMINOPHEN 500 MG PO TABS
1000.0000 mg | ORAL_TABLET | ORAL | Status: AC
  Administered 2022-07-27: 1000 mg via ORAL
  Filled 2022-07-27: qty 2

## 2022-07-27 MED ORDER — BACITRACIN-NEOMYCIN-POLYMYXIN OINTMENT TUBE
TOPICAL_OINTMENT | CUTANEOUS | Status: AC
  Filled 2022-07-27: qty 14.17

## 2022-07-27 MED ORDER — MIDAZOLAM HCL 5 MG/5ML IJ SOLN
INTRAMUSCULAR | Status: DC | PRN
  Administered 2022-07-27: 2 mg via INTRAVENOUS

## 2022-07-27 MED ORDER — BUPIVACAINE LIPOSOME 1.3 % IJ SUSP
INTRAMUSCULAR | Status: AC
  Filled 2022-07-27: qty 20

## 2022-07-27 MED ORDER — LIDOCAINE 2% (20 MG/ML) 5 ML SYRINGE
INTRAMUSCULAR | Status: DC | PRN
  Administered 2022-07-27: 80 mg via INTRAVENOUS

## 2022-07-27 MED ORDER — HYDROMORPHONE HCL 1 MG/ML IJ SOLN
INTRAMUSCULAR | Status: AC
  Filled 2022-07-27: qty 1

## 2022-07-27 MED ORDER — MIDAZOLAM HCL 2 MG/2ML IJ SOLN
INTRAMUSCULAR | Status: AC
  Filled 2022-07-27: qty 2

## 2022-07-27 MED ORDER — TRAMADOL HCL 50 MG PO TABS: 50.0000 mg | ORAL_TABLET | Freq: Four times a day (QID) | ORAL | 0 refills | Status: AC | PRN

## 2022-07-27 SURGICAL SUPPLY — 33 items
BAG COUNTER SPONGE SURGICOUNT (BAG) IMPLANT
BAG SPNG CNTER NS LX DISP (BAG)
BRIEF MESH DISP LRG (UNDERPADS AND DIAPERS) ×1 IMPLANT
DISSECTOR SURG LIGASURE 21 (MISCELLANEOUS) IMPLANT
ELECT NDL BLADE 2-5/6 (NEEDLE) ×1 IMPLANT
ELECT NEEDLE BLADE 2-5/6 (NEEDLE) ×1 IMPLANT
ELECT REM PT RETURN 15FT ADLT (MISCELLANEOUS) ×1 IMPLANT
GAUZE PAD ABD 8X10 STRL (GAUZE/BANDAGES/DRESSINGS) IMPLANT
GAUZE SPONGE 4X4 12PLY STRL (GAUZE/BANDAGES/DRESSINGS) IMPLANT
GLOVE BIO SURGEON STRL SZ7.5 (GLOVE) ×1 IMPLANT
GLOVE INDICATOR 8.0 STRL GRN (GLOVE) ×1 IMPLANT
GOWN STRL REUS W/ TWL XL LVL3 (GOWN DISPOSABLE) ×2 IMPLANT
GOWN STRL REUS W/TWL XL LVL3 (GOWN DISPOSABLE) ×2
KIT BASIN OR (CUSTOM PROCEDURE TRAY) ×1 IMPLANT
KIT TURNOVER KIT A (KITS) IMPLANT
NDL HYPO 22X1.5 SAFETY MO (MISCELLANEOUS) ×1 IMPLANT
NEEDLE HYPO 22X1.5 SAFETY MO (MISCELLANEOUS) ×1 IMPLANT
NEEDLE SAFETY HYPO 22GAX1.5 (MISCELLANEOUS) ×1
PACK GENERAL/GYN (CUSTOM PROCEDURE TRAY) ×1 IMPLANT
SHEARS HARMONIC 9CM CVD (BLADE) IMPLANT
SPIKE FLUID TRANSFER (MISCELLANEOUS) ×1 IMPLANT
SPONGE SURGIFOAM ABS GEL 100 (HEMOSTASIS) IMPLANT
SURGILUBE 2OZ TUBE FLIPTOP (MISCELLANEOUS) ×1 IMPLANT
SUT CHROMIC 2 0 SH (SUTURE) IMPLANT
SUT CHROMIC 3 0 SH 27 (SUTURE) IMPLANT
SUT MNCRL AB 4-0 PS2 18 (SUTURE) ×1 IMPLANT
SUT VIC AB 2-0 SH 27 (SUTURE) ×2
SUT VIC AB 2-0 SH 27X BRD (SUTURE) IMPLANT
SUT VIC AB 3-0 SH 27 (SUTURE) ×1
SUT VIC AB 3-0 SH 27X BRD (SUTURE) ×1 IMPLANT
SYR 20ML LL LF (SYRINGE) ×1 IMPLANT
TOWEL OR 17X26 10 PK STRL BLUE (TOWEL DISPOSABLE) ×1 IMPLANT
TOWEL OR NON WOVEN STRL DISP B (DISPOSABLE) ×1 IMPLANT

## 2022-07-27 NOTE — Discharge Instructions (Addendum)
ANORECTAL SURGERY: POST OP INSTRUCTIONS  DIET: Follow a light bland diet the first 24 hours after arrival home, such as soup, liquids, crackers, etc.  Be sure to include lots of fluids daily.  Avoid fast food or heavy meals as your are more likely to get nauseated.  Eat a low fat diet the next few days after surgery.   Some bleeding with bowel movements is expected for the first couple of days but this should stop in between bowel movements  Take your usually prescribed home medications unless otherwise directed. No foreign bodies per rectum for the next 3 months (enemas, etc)  PAIN CONTROL: It is helpful to take an over-the-counter pain medication regularly for the first few days/weeks.  Choose from the following that works best for you: Ibuprofen (Advil, etc) Three 200mg tabs every 6 hours as needed. Acetaminophen (Tylenol, etc) 500-650mg every 6 hours as needed NOTE: You may take both of these medications together - most patients find it most helpful when alternating between the two (i.e. Ibuprofen at 6am, tylenol at 9am, ibuprofen at 12pm ...) A  prescription for pain medication may have been prescribed for you at discharge.  Take your pain medication as prescribed.  If you are having problems/concerns with the prescription medicine, please call us for further advice.  Avoid getting constipated.  Between the surgery and the pain medications, it is common to experience some constipation.  Increasing fluid intake (64oz of water per day) and taking a fiber supplement (such as Metamucil, Citrucel, FiberCon) 1-2 times a day regularly will usually help prevent this problem from occurring.  Take Miralax (over the counter) 1-2x/day while taking a narcotic pain medication. If no bowel movement after 48hours, you may additionally take a laxative like a bottle of Milk of Magnesia which can be purchased over the counter. Avoid enemas.   Watch out for diarrhea.  If you have many loose bowel movements,  simplify your diet to bland foods.  Stop any stool softeners and decrease your fiber supplement. If this worsens or does not improve, please call us.  Wash / shower every day.  If you were discharged with a dressing, you may remove this the day after your surgery. You may shower normally, getting soap/water on your wound, particularly after bowel movements.  Soaking in a warm bath filled a couple inches ("Sitz bath") is a great way to clean the area after a bowel movement and many patients find it is a way to soothe the area.  ACTIVITIES as tolerated:   You may resume regular (light) daily activities beginning the next day--such as daily self-care, walking, climbing stairs--gradually increasing activities as tolerated.  If you can walk 30 minutes without difficulty, it is safe to try more intense activity such as jogging, treadmill, bicycling, low-impact aerobics, etc. Refrain from any heavy lifting or straining for the first 2 weeks after your procedure, particularly if your surgery was for hemorrhoids. Avoid activities that make your pain worse You may drive when you are no longer taking prescription pain medication, you can comfortably wear a seatbelt, and you can safely maneuver your car and apply brakes.  FOLLOW UP in our office Please call [facility] at [phone] to set up an appointment to see your surgeon in the office for a follow-up appointment approximately 2 weeks after your surgery. Make sure that you call for this appointment the day you arrive home to insure a convenient appointment time.  9. If you have disability or family leave forms   that need to be completed, you may have them completed by your primary care physician's office; for return to work instructions, please ask our office staff and they will be happy to assist you in obtaining this documentation   When to call us [phone]: Poor pain control Reactions / problems with new medications (rash/itching, etc)  Fever over  101.5 F (38.5 C) Inability to urinate Nausea/vomiting Worsening swelling or bruising Continued bleeding from incision. Increased pain, redness, or drainage from the incision  The clinic staff is available to answer your questions during regular business hours (8:30am-5pm).  Please don't hesitate to call and ask to speak to one of our nurses for clinical concerns.   A surgeon from [facility] is always on call at the hospitals   If you have a medical emergency, go to the nearest emergency room or call 911.   [facility] [address], [address], [place], [place]  [address] MAIN: [phone] FAX: [phone] [email or web address] 

## 2022-07-27 NOTE — Op Note (Signed)
07/27/2022  8:39 AM  PATIENT:  Wayne Miller  42 y.o. male  Patient Care Team: Saguier, Iris Pert as PCP - General (Internal Medicine) Patient, No Pcp Per (General Practice)  PRE-OPERATIVE DIAGNOSIS:  Medically refractory hemorrhoidal prolapse  POST-OPERATIVE DIAGNOSIS:  Same  PROCEDURE:   Hemorrhoidectomy - right lateral Hemorrhoidectomy - right anterior Anorectal exam under anesthesia  SURGEON:  Surgeon(s): Ileana Roup, MD  ASSISTANT: OR Staff   ANESTHESIA:   local and general  SPECIMEN:   Hemorrhoidal tissue right lateral Hemorrhoidal tissue right anterior  DISPOSITION OF SPECIMEN:  PATHOLOGY  COUNTS:  Sponge, needle, and instrument counts were reported correct x2 at conclusion.  EBL: 20 mL  Drains: None  PLAN OF CARE: Discharge to home after PACU  PATIENT DISPOSITION:  PACU - hemodynamically stable.  OR FINDINGS: Large internal hemorrhoid right latera; mixed internal + external hemorrhoidal tissue right anterior. 2 column hemorrhoidectomy carried out uneventfully  DESCRIPTION: The patient was identified in the preoperative holding area and taken to the OR. SCDs were applied. He then underwent general endotracheal anesthesia without difficulty. The patient was then rolled onto the OR table in the prone jackknife position. Pressure points were then evaluated and padded. Benzoin was applied to the buttocks and they were gently taped apart.  He was then prepped and draped in usual sterile fashion.  A surgical timeout was performed indicating the correct patient, procedure, and positioning.  A perianal block was then created using a dilute mixture of 0.25% Marcaine with epinephrine and Exparel.  After ascertaining an appropriate level of anesthesia had been achieved, a well lubricated digital rectal exam was performed. This demonstrated no palpable masses.  A Hill-Ferguson anoscope was into the anal canal and circumferential inspection demonstrated.  Healthy  appearing anoderm.  No ulcerations.  No fissures.  2 large hemorrhoidal columns-largest being in the right lateral position.  He also has a mixed internal/external component in the right anterior position.  No significant hemorrhoids elsewhere.  2 column hemorrhoidectomy is therefore planned.  Beginning with the right lateral hemorrhoidal tissue, with an anoscope in place, this is elevated the DeBakey forcep.  The anoderm was incised and the distalmost aspect of this is dissected free of the underlying sphincter muscle.  Following this, given the size and relative engorgement of this hemorrhoid, a hand-held LigaSure was used to complete the hemorrhoidectomy.  All sphincter muscle was dissected free of the hemorrhoidal tissue prior to dividing anything.  The hemorrhoidal tissue was then passed off the specimen.  Hemorrhoidal varicosities overlying the sphincter muscle are fulgurated.  Hemostasis is verified.  The defect was then closed using a 2-0 Vicryl figure-of-eight suture at the apex followed by running 2-0 Vicryl locking sutures and transitioning externally to a 3-0 Vicryl suture.  The anal canal was then irrigated and hemostasis verified.  Attention is then directed at the right anterolateral hemorrhoidal tissue. With an anoscope in place, this is also then elevated with a DeBakey forcep.  The anoderm was incised and the distalmost aspect of this is dissected free of the underlying sphincter muscle.  Following this, given the size and relative engorgement of this hemorrhoid, a hand-held LigaSure was used to complete the hemorrhoidectomy.  All sphincter muscle was dissected free of the hemorrhoidal tissue prior to dividing anything.  The hemorrhoidal tissue was then passed off the specimen.  Hemorrhoidal varicosities overlying the sphincter muscle are fulgurated.  Hemostasis is verified.  The defect was then closed using a 2-0 Vicryl figure-of-eight suture at the  apex followed by running 2-0 Vicryl locking  sutures and transitioning externally to a 3-0 Vicryl suture.  The anal canal was then irrigated and hemostasis verified.  Additional local anesthetic is infiltrated around the area of hemorrhoidectomy.  All sponge, needle, and instrument counts were reported correct.  The buttocks are untaped.  A dressing consisting of 4 x 4's, ABD, mesh underwear was placed.  He is then rolled back onto a stretcher, awakened from anesthesia, extubated, and transported to the recovery room in satisfactory condition.  DISPOSITION: PACU in satisfactory condition.

## 2022-07-27 NOTE — Anesthesia Procedure Notes (Signed)
Procedure Name: Intubation Date/Time: 07/27/2022 7:35 AM  Performed by: Victoriano Lain, CRNAPre-anesthesia Checklist: Patient identified, Emergency Drugs available, Suction available, Patient being monitored and Timeout performed Patient Re-evaluated:Patient Re-evaluated prior to induction Oxygen Delivery Method: Circle system utilized Preoxygenation: Pre-oxygenation with 100% oxygen Induction Type: IV induction Ventilation: Mask ventilation without difficulty Laryngoscope Size: Mac and 4 Grade View: Grade II Tube type: Oral Tube size: 7.5 mm Number of attempts: 1 Airway Equipment and Method: Stylet Placement Confirmation: ETT inserted through vocal cords under direct vision, positive ETCO2 and breath sounds checked- equal and bilateral Secured at: 23 cm Tube secured with: Tape Dental Injury: Teeth and Oropharynx as per pre-operative assessment

## 2022-07-27 NOTE — Anesthesia Postprocedure Evaluation (Signed)
Anesthesia Post Note  Patient: Wayne Miller  Procedure(s) Performed: HEMORRHOIDECTOMY, ANORECTAL EXAM UNDER ANESTHESIA     Patient location during evaluation: PACU Anesthesia Type: General Level of consciousness: awake and alert and oriented Pain management: pain level controlled Vital Signs Assessment: post-procedure vital signs reviewed and stable Respiratory status: spontaneous breathing, nonlabored ventilation and respiratory function stable Cardiovascular status: blood pressure returned to baseline and stable Postop Assessment: no apparent nausea or vomiting Anesthetic complications: no   No notable events documented.  Last Vitals:  Vitals:   07/27/22 0915 07/27/22 0930  BP: (!) 141/94 (!) 138/99  Pulse: 70 76  Resp: 14 12  Temp:    SpO2: 96% 97%    Last Pain:  Vitals:   07/27/22 0930  TempSrc:   PainSc: 3                  Buddy Loeffelholz A.

## 2022-07-27 NOTE — H&P (Signed)
CC: Here today for surgery  HPI: Wayne Miller is an 42 y.o. male with history of GERD, whom is seen in the office for evaluation of possible hemorrhoids  Colonoscopy with Dr. Lyndel Miller 07/22/2021: 1. 6 mm polyp in cecum, removed-pathology, colonic mucosa 2. 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 Nonbleeding hemorrhoids 4. Normal TI  He reports an at least 1 year history of some discomfort associated bowel movements and bright red blood per rectum when he has a bowel movement. He has struggled with some degree of hemorrhoidal type prolapse. He is here today with his wife. They report that he has been taking fiber supplements daily now Benefiber. He has previously tried Metamucil Gummies and did not care for the consistency of the Metamucil powder. He drinks approximately 6 bottles of water per Miller. He has a bowel movement every other Miller and it is generally hard/formed. Has straining with bowel movements. Spends approximately 20 to 30 minutes on the commode.  INTERVAL HX Since he was last seen 02/13/2022, he has had ongoing issues with discomfort with his bowel movements and some bright red blood per rectum. Still with some prolapse. He has worked to keep his stool soft and minimize his commode times to 2 to 3 minutes. Despite all these maneuvers, he continues to have issues and remains motivated to have surgery.  PMH: GERD  PSH: He denies any prior abdominal, pelvic, or anorectal surgical history  FHx: Denies any known family history of colorectal, breast, endometrial or ovarian cancer  Social Hx: Denies use of tobacco/EtOH/illicit drug. 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 He is here today with his wife.   Past Medical History:  Diagnosis Date   Blood in stool    Fatty liver    GERD (gastroesophageal reflux disease)    Herniated nucleus pulposis of lumbosacral region    Multiple gastric ulcers     Past Surgical History:   Procedure Laterality Date   KNEE ARTHROSCOPY Left    left knee scope "tear repair"   LUMBAR LAMINECTOMY/DECOMPRESSION MICRODISCECTOMY Right 09/11/2015   Procedure: MICRO LUMBAR DECOMPRESSION OF RIGHT L4-5 AND L5-S1 AND RIGHT FORAMINOTOMY L5 (2 LEVELS);  Surgeon: Wayne Day, MD;  Location: [facility];  Service: Orthopedics;  Laterality: Right;    Family History  Problem Relation Age of Onset   Arthritis Mother    Diabetes Mother    Hyperlipidemia Mother    Hypertension Mother    Arthritis Father    Depression Father    Diabetes Father    Hypertension Father    Kidney disease Father    Birth defects Brother    Throat cancer Paternal Aunt    Arthritis Maternal Grandmother    Hypertension Maternal Grandmother    Alcohol abuse Maternal Grandfather    Prostate cancer Maternal Grandfather    Arthritis Paternal Grandmother    Colon cancer Neg Hx    Rectal cancer Neg Hx    Stomach cancer Neg Hx    Esophageal cancer Neg Hx     Social:  reports that he quit smoking about 16 years ago. His smoking use included cigarettes. He has never used smokeless tobacco. He reports that he does not currently use alcohol. He reports that he does not use drugs.  Allergies:  Allergies  Allergen Reactions   Ibuprofen Other (See Comments)    G I upset    Medications: I have reviewed the patient's current medications.  No results found  for this or any previous visit (from the past 48 hour(s)).  No results found.  ROS - all of the below systems have been reviewed with the patient and positives are indicated with bold text General: chills, fever or night sweats Eyes: blurry vision or double vision ENT: epistaxis or sore throat Allergy/Immunology: itchy/watery eyes or nasal congestion Hematologic/Lymphatic: bleeding problems, blood clots or swollen lymph nodes Endocrine: temperature intolerance or unexpected weight changes Breast: new or changing breast lumps or nipple discharge Resp: cough,  shortness of breath, or wheezing CV: chest pain or dyspnea on exertion GI: as per HPI GU: dysuria, trouble voiding, or hematuria MSK: joint pain or joint stiffness Neuro: TIA or stroke symptoms Derm: pruritus and skin lesion changes Psych: anxiety and depression  PE Blood pressure (!) 142/99, pulse (!) 58, temperature 98 F (36.7 C), temperature source Oral, resp. rate 17, SpO2 98 %. Constitutional: NAD; conversant Eyes: Moist conjunctiva Lungs: Normal respiratory effort CV: RRR MSK: Normal range of motion of extremities Psychiatric: Appropriate affect  No results found for this or any previous visit (from the past 48 hour(s)).  No results found.   A/P: Wayne Miller is an 42 y.o. male with hx of GERD here for surgery for hemorrhoids  -The anatomy and physiology of the anal canal was discussed with the patient with associated pictures. The pathophysiology of hemorrhoids was discussed at length with associated pictures and illustrations. -We have reviewed options going forward including further observation vs surgery - hemorrhoidectomy, anorectal exam under anesthesia -The planned procedure, material risks (including, but not limited to, pain, bleeding, infection, scarring, need for blood transfusion, damage to anal sphincter, incontinence of gas and/or stool, need for additional procedures, anal stenosis, rare cases of pelvic sepsis which in severe cases may require things like a colostomy, recurrence, pneumonia, heart attack, stroke, death) benefits and alternatives to surgery were discussed at length. I noted a good probability that the procedure would help improve their symptoms. The patient's questions were answered to his satisfaction, he voiced understanding and elected to proceed with surgery. Additionally, we discussed typical postoperative expectations and the recovery process.   Wayne Miller, [facility], [place]

## 2022-07-27 NOTE — Transfer of Care (Signed)
Immediate Anesthesia Transfer of Care Note  Patient: Wayne Miller  Procedure(s) Performed: HEMORRHOIDECTOMY, ANORECTAL EXAM UNDER ANESTHESIA  Patient Location: PACU  Anesthesia Type:General  Level of Consciousness: awake, alert , oriented, and patient cooperative  Airway & Oxygen Therapy: Patient Spontanous Breathing and Patient connected to face mask oxygen  Post-op Assessment: Report given to RN, Post -op Vital signs reviewed and stable, and Patient moving all extremities  Post vital signs: Reviewed and stable  Last Vitals:  Vitals Value Taken Time  BP 146/95 07/27/22 0852  Temp    Pulse 74 07/27/22 0853  Resp 12 07/27/22 0853  SpO2 100 % 07/27/22 0853  Vitals shown include unvalidated device data.  Last Pain:  Vitals:   07/27/22 0614  TempSrc:   PainSc: 0-No pain         Complications: No notable events documented.

## 2022-07-28 ENCOUNTER — Encounter (HOSPITAL_COMMUNITY): Payer: Self-pay | Admitting: Surgery

## 2022-07-28 LAB — SURGICAL PATHOLOGY

## 2022-08-04 ENCOUNTER — Encounter: Payer: Self-pay | Admitting: Gastroenterology

## 2022-08-04 DIAGNOSIS — F5101 Primary insomnia: Secondary | ICD-10-CM | POA: Diagnosis not present

## 2022-08-04 NOTE — Telephone Encounter (Signed)
Spoke with pt. Pt requested prep instructions for tomorrow. Pt stated that he speaks [place]. Prep instructions created and sent to pt via my chart. Pt made aware.  Pt verbalized understanding with all questions answered.

## 2022-08-05 ENCOUNTER — Ambulatory Visit (AMBULATORY_SURGERY_CENTER): Payer: BC Managed Care – PPO | Admitting: Gastroenterology

## 2022-08-05 ENCOUNTER — Encounter: Payer: Self-pay | Admitting: Gastroenterology

## 2022-08-05 VITALS — BP 124/89 | HR 59 | Temp 98.3°F | Resp 13 | Ht 68.0 in | Wt 218.0 lb

## 2022-08-05 DIAGNOSIS — K219 Gastro-esophageal reflux disease without esophagitis: Secondary | ICD-10-CM

## 2022-08-05 DIAGNOSIS — K257 Chronic gastric ulcer without hemorrhage or perforation: Secondary | ICD-10-CM

## 2022-08-05 DIAGNOSIS — K259 Gastric ulcer, unspecified as acute or chronic, without hemorrhage or perforation: Secondary | ICD-10-CM | POA: Diagnosis not present

## 2022-08-05 DIAGNOSIS — R1013 Epigastric pain: Secondary | ICD-10-CM

## 2022-08-05 MED ORDER — SODIUM CHLORIDE 0.9 % IV SOLN: 500.0000 mL | Freq: Once | INTRAVENOUS | Status: DC

## 2022-08-05 NOTE — Progress Notes (Signed)
Chief Complaint: For FU  Referring Provider:  Mackie Pai, PA-C      ASSESSMENT AND PLAN;   #1. GERD/epi pain. Neg [place] except for fatty liver 03/2022. Nl CBC, CMP, lipase  #2. IBS-C. Neg colon 07/2021 except for small hyperplastic polyp.   Plan: -Increased protonix '40mg'$  po QD #90, 4RF -EGD -Miralax 17g po QD with 8oz water -Continue reduce to weight.  Proceed with EGD. I have discussed the risks and benefits. The risks including rare risk of perforation, bleeding, missed UGI neoplasms, risks of anesthesia/sedation. Alternatives were given. Patient is aware and agrees to proceed. All the questions were answered. This will be scheduled in upcoming days. Consent forms were given for review. HPI:    Wayne Miller is a 42 y.o. male  history of anxiety, insomnia, GERD history of gastric ulcers   For FU Still with postprandial epigastric pain and burning, somewhat better with the Protonix 20 mg p.o. daily.  No odynophagia or dysphagia Underwent ultrasound Abdo which was negative except for fatty liver.  Normal gallbladder Normal CBC, CMP and lipase No nonsteroidals.  Okay to proceed with EGD  Constipation is better.  Still having bowel movements 1 in 2-3 days.  Has not been taking MiraLAX every day.  Rectal bleeding is only on the tissue.  He has appointment with Dr. Dema Severin regarding that. Has been trying to reduce weight  Wt Readings from Last 3 Encounters:  08/05/22 218 lb (98.9 kg)  07/17/22 211 lb (95.7 kg)  07/07/22 218 lb (98.9 kg)    Past GI workup: Colonoscopy 07/22/2021 -Small hyperplastic polyp -Mild div -Internal hemorrhoids -Otherwise normal.  Repeat in 10 years   [place] Abdo 03/29/2022: fatty liver.  Normal gallbladder.  Past Medical History:  Diagnosis Date   Blood in stool    Fatty liver    GERD (gastroesophageal reflux disease)    Herniated nucleus pulposis of lumbosacral region    Multiple gastric ulcers     Past Surgical History:  Procedure  Laterality Date   EVALUATION UNDER ANESTHESIA WITH HEMORRHOIDECTOMY N/A 07/27/2022   Procedure: HEMORRHOIDECTOMY, ANORECTAL EXAM UNDER ANESTHESIA;  Surgeon: Ileana Roup, MD;  Location: [facility];  Service: General;  Laterality: N/A;   KNEE ARTHROSCOPY Left    left knee scope "tear repair"   LUMBAR LAMINECTOMY/DECOMPRESSION MICRODISCECTOMY Right 09/11/2015   Procedure: MICRO LUMBAR DECOMPRESSION OF RIGHT L4-5 AND L5-S1 AND RIGHT FORAMINOTOMY L5 (2 LEVELS);  Surgeon: Susa Day, MD;  Location: [facility];  Service: Orthopedics;  Laterality: Right;   UPPER GASTROINTESTINAL ENDOSCOPY      Family History  Problem Relation Age of Onset   Arthritis Mother    Diabetes Mother    Hyperlipidemia Mother    Hypertension Mother    Arthritis Father    Depression Father    Diabetes Father    Hypertension Father    Kidney disease Father    Birth defects Brother    Throat cancer Paternal Aunt    Arthritis Maternal Grandmother    Hypertension Maternal Grandmother    Alcohol abuse Maternal Grandfather    Prostate cancer Maternal Grandfather    Arthritis Paternal Grandmother    Colon cancer Neg Hx    Rectal cancer Neg Hx    Stomach cancer Neg Hx    Esophageal cancer Neg Hx     Social History   Tobacco Use   Smoking status: Former    Types: Cigarettes    Quit date: 08/29/2005    Years since  quitting: 16.9   Smokeless tobacco: Never  Vaping Use   Vaping Use: Never used  Substance Use Topics   Alcohol use: Not Currently    Comment: past -not now   Drug use: No    Current Outpatient Medications  Medication Sig Dispense Refill   famotidine (PEPCID) 20 MG tablet Take 1 tablet (20 mg total) by mouth daily. (Patient not taking: Reported on 07/07/2022) 30 tablet 3   fluticasone (CUTIVATE) 0.05 % cream Apply topically 2 (two) times daily. For 10 days apply to rectum area (Patient not taking: Reported on 07/15/2022) 30 g 2   hydrOXYzine (ATARAX/VISTARIL) 25 MG tablet TAKE 1 TABLET BY MOUTH AT  BEDTIME FOR  INSOMNIA  OR  ANXIETY (Patient not taking: Reported on 07/07/2022) 30 tablet 3   pantoprazole (PROTONIX) 40 MG tablet Take 1 tablet (40 mg total) by mouth daily. (Patient not taking: Reported on 08/05/2022) 90 tablet 3   Current Facility-Administered Medications  Medication Dose Route Frequency Provider Last Rate Last Admin   0.9 %  sodium chloride infusion  500 mL Intravenous Once Jackquline Denmark, MD        Allergies  Allergen Reactions   Zolpidem Nausea And Vomiting   Ibuprofen Other (See Comments)    G I upset    Review of Systems:  Constitutional: Denies fever, chills, diaphoresis, appetite change and fatigue.  HEENT: Denies photophobia, eye pain, redness, hearing loss, ear pain, congestion, sore throat, rhinorrhea, sneezing, mouth sores, neck pain, neck stiffness and tinnitus.   Respiratory: Denies SOB, DOE, cough, chest tightness,  and wheezing.   Cardiovascular: Denies chest pain, palpitations and leg swelling.  Genitourinary: Denies dysuria, urgency, frequency, hematuria, flank pain and difficulty urinating.  Musculoskeletal: Denies myalgias, back pain, joint swelling, arthralgias and gait problem.  Skin: No rash.  Neurological: Denies dizziness, seizures, syncope, weakness, light-headedness, numbness and headaches.  Hematological: Denies adenopathy. Easy bruising, personal or family bleeding history  Psychiatric/Behavioral: No anxiety or depression     Physical Exam:    BP 136/78   Pulse 62   Temp 98.3 F (36.8 C) (Temporal)   Ht '5\' 8"'$  (1.727 m)   Wt 218 lb (98.9 kg)   SpO2 98%   BMI 33.15 kg/m  Wt Readings from Last 3 Encounters:  08/05/22 218 lb (98.9 kg)  07/17/22 211 lb (95.7 kg)  07/07/22 218 lb (98.9 kg)   Constitutional:  Well-developed, in no acute distress. Psychiatric: Normal mood and affect. Behavior is normal. HEENT: Pupils normal.  Conjunctivae are normal. No scleral icterus. Cardiovascular: Normal rate, regular rhythm. No  edema Pulmonary/chest: Effort normal and breath sounds normal. No wheezing, rales or rhonchi. Abdominal: Soft, nondistended. Mild epi tender. Bowel sounds active throughout. There are no masses palpable. No hepatomegaly. Rectal: Deferred Neurological: Alert and oriented to person place and time. Skin: Skin is warm and dry. No rashes noted.  Data Reviewed: I have personally reviewed following labs and imaging studies  CBC:    Latest Ref Rng & Units 07/17/2022    8:31 AM 04/03/2022    8:49 AM 03/26/2022    9:11 AM  CBC  WBC 4.0 - 10.5 K/uL 7.4  5.9  8.1   Hemoglobin 13.0 - 17.0 g/dL 16.6  14.9  15.9   Hematocrit 39.0 - 52.0 % 51.2  45.3  48.1   Platelets 150 - 400 K/uL 282  234.0  241.0     CMP:    Latest Ref Rng & Units 04/03/2022    8:49 AM  03/26/2022    9:11 AM 04/22/2021    9:15 AM  CMP  Glucose 70 - 99 mg/dL 81  92  92   BUN 6 - 23 mg/dL '12  14  15   '$ Creatinine 0.40 - 1.50 mg/dL 0.94  1.09  0.97   Sodium 135 - 145 mEq/L 140  139  142   Potassium 3.5 - 5.1 mEq/L 4.3  4.5  4.5   Chloride 96 - 112 mEq/L 107  105  109   CO2 19 - 32 mEq/L '27  27  27   '$ Calcium 8.4 - 10.5 mg/dL 8.8  9.2  8.8   Total Protein 6.0 - 8.3 g/dL 6.2  7.1  6.6   Total Bilirubin 0.2 - 1.2 mg/dL 0.6  0.7  0.5   Alkaline Phos 39 - 117 U/L 74  76  70   AST 0 - 37 U/L '13  16  16   '$ ALT 0 - 53 U/L '14  18  20         '$ Carmell Austria, MD 08/05/2022, 8:37 AM  Cc: Mackie Pai, PA-C

## 2022-08-05 NOTE — Progress Notes (Signed)
A and O x3. Report to RN. Tolerated MAC anesthesia well.Teeth unchanged after procedure. 

## 2022-08-05 NOTE — Progress Notes (Signed)
Called to room to assist during endoscopic procedure.  Patient ID and intended procedure confirmed with present staff. Received instructions for my participation in the procedure from the performing physician.  

## 2022-08-05 NOTE — Patient Instructions (Addendum)
Take Protonix 40 mg daily  NO ASPIRIN, ASPIRIN CONTAINING PRODUCTS (BC OR GOODY POWDERS) OR NSAIDS (IBUPROFEN, ADVIL, ALEVE, AND MOTRIN) ; TYLENOL IS OK TO TAKE Avoid alcohol Follow up in the office on 11/04/22 at 8:30  YOU HAD AN ENDOSCOPIC PROCEDURE TODAY AT [facility]:   Refer to the procedure report that was given to you for any specific questions about what was found during the examination.  If the procedure report does not answer your questions, please call your gastroenterologist to clarify.  If you requested that your care partner not be given the details of your procedure findings, then the procedure report has been included in a sealed envelope for you to review at your convenience later.  YOU SHOULD EXPECT: Some feelings of bloating in the abdomen. Passage of more gas than usual.  Walking can help get rid of the air that was put into your GI tract during the procedure and reduce the bloating. If you had a lower endoscopy (such as a colonoscopy or flexible sigmoidoscopy) you may notice spotting of blood in your stool or on the toilet paper. If you underwent a bowel prep for your procedure, you may not have a normal bowel movement for a few days.  Please Note:  You might notice some irritation and congestion in your nose or some drainage.  This is from the oxygen used during your procedure.  There is no need for concern and it should clear up in a day or so.  SYMPTOMS TO REPORT IMMEDIATELY:  Following upper endoscopy (EGD)  Vomiting of blood or coffee ground material  New chest pain or pain under the shoulder blades  Painful or persistently difficult swallowing  New shortness of breath  Fever of 100F or higher  Black, tarry-looking stools  For urgent or emergent issues, a gastroenterologist can be reached at any hour by calling [phone]. Do not use MyChart messaging for urgent concerns.    DIET:  We do recommend a small meal at first, but then you may proceed  to your regular diet.  Drink plenty of fluids but you should avoid alcoholic beverages for 24 hours.  ACTIVITY:  You should plan to take it easy for the rest of today and you should NOT DRIVE or use heavy machinery until tomorrow (because of the sedation medicines used during the test).    FOLLOW UP: Our staff will call the number listed on your records the next business day following your procedure.  We will call around 7:15- 8:00 am to check on you and address any questions or concerns that you may have regarding the information given to you following your procedure. If we do not reach you, we will leave a message.     If any biopsies were taken you will be contacted by phone or by letter within the next 1-3 weeks.  Please call us at [phone] if you have not heard about the biopsies in 3 weeks.    SIGNATURES/CONFIDENTIALITY: You and/or your care partner have signed paperwork which will be entered into your electronic medical record.  These signatures attest to the fact that that the information above on your After Visit Summary has been reviewed and is understood.  Full responsibility of the confidentiality of this discharge information lies with you and/or your care-partner.

## 2022-08-05 NOTE — Progress Notes (Signed)
VS completed by CW.   I have reviewed the patient's medical history in detail and updated the computerized patient record.

## 2022-08-05 NOTE — Op Note (Signed)
[facility] Patient Name: Wayne Miller Procedure Date: 08/05/2022 8:29 AM MRN: [id number] Endoscopist: Jackquline Denmark , MD, [id number] Age: 42 Referring MD:  Date of Birth: [birth year] Gender: Male Account #: [id number] Procedure:                Upper GI endoscopy Indications:              Epigastric abdominal pain with negative extensive                            workup Medicines:                Monitored Anesthesia Care Procedure:                Pre-Anesthesia Assessment:                           - Prior to the procedure, a History and Physical                            was performed, and patient medications and                            allergies were reviewed. The patient's tolerance of                            previous anesthesia was also reviewed. The risks                            and benefits of the procedure and the sedation                            options and risks were discussed with the patient.                            All questions were answered, and informed consent                            was obtained. Prior Anticoagulants: The patient has                            taken no anticoagulant or antiplatelet agents. ASA                            Grade Assessment: II - A patient with mild systemic                            disease. After reviewing the risks and benefits,                            the patient was deemed in satisfactory condition to                            undergo the procedure.  After obtaining informed consent, the endoscope was                            passed under direct vision. Throughout the                            procedure, the patient's blood pressure, pulse, and                            oxygen saturations were monitored continuously. The                            Olympus Scope [id number] was introduced through the                            mouth, and advanced to the second part of  duodenum.                            The upper GI endoscopy was accomplished without                            difficulty. The patient tolerated the procedure                            well. Scope In: Scope Out: Findings:                 The examined esophagus was normal with well-defined                            Z-line at 40 cm, examined by NBI.                           One non-bleeding cratered healing gastric ulcer                            with no stigmata of bleeding was found in the                            gastric antrum on inflammatory appearing antral                            polyp. The lesion was 6 mm in largest dimension.                            There was mild surrounding gastritis. Multiple                            biopsies were taken with a cold forceps for                            histology also using tunnel technique.                           The examined  duodenum was normal. Complications:            No immediate complications. Estimated Blood Loss:     Estimated blood loss: none. Impression:               - Non-bleeding healing gastric ulcer with no                            stigmata of bleeding. Biopsied.                           - Otherwise normal EGD. Recommendation:           - Patient has a contact number available for                            emergencies. The signs and symptoms of potential                            delayed complications were discussed with the                            patient. Return to normal activities tomorrow.                            Written discharge instructions were provided to the                            patient.                           - Resume previous diet.                           - Continue present medications including Protonix                            40 mg p.o. daily.                           - Await pathology results.                           - No ibuprofen, naproxen, or other non-steroidal                             anti-inflammatory drugs.                           - Avoid alcohol.                           - FU GI clinic 12 weeks.                           - The findings and recommendations were discussed  with the patient's family. Jackquline Denmark, MD 08/05/2022 8:57:11 AM This report has been signed electronically.

## 2022-08-06 ENCOUNTER — Telehealth: Payer: Self-pay

## 2022-08-06 NOTE — Telephone Encounter (Signed)
Attempted call to patient; no answer; left VM

## 2022-08-15 ENCOUNTER — Encounter: Payer: Self-pay | Admitting: Gastroenterology

## 2022-09-07 ENCOUNTER — Ambulatory Visit (INDEPENDENT_AMBULATORY_CARE_PROVIDER_SITE_OTHER): Payer: BC Managed Care – PPO | Admitting: Medical

## 2022-09-07 VITALS — BP 130/86 | HR 70 | Temp 98.0°F | Resp 18 | Ht 68.0 in | Wt 207.0 lb

## 2022-09-07 DIAGNOSIS — R739 Hyperglycemia, unspecified: Secondary | ICD-10-CM | POA: Diagnosis not present

## 2022-09-07 DIAGNOSIS — R350 Frequency of micturition: Secondary | ICD-10-CM

## 2022-09-07 DIAGNOSIS — R3911 Hesitancy of micturition: Secondary | ICD-10-CM | POA: Diagnosis not present

## 2022-09-07 LAB — COMPREHENSIVE METABOLIC PANEL
ALT: 17 U/L (ref 0–53)
AST: 18 U/L (ref 0–37)
Albumin: 4.5 g/dL (ref 3.5–5.2)
Alkaline Phosphatase: 86 U/L (ref 39–117)
BUN: 10 mg/dL (ref 6–23)
CO2: 25 mEq/L (ref 19–32)
Calcium: 9.3 mg/dL (ref 8.4–10.5)
Chloride: 107 mEq/L (ref 96–112)
Creatinine, Ser: 1.01 mg/dL (ref 0.40–1.50)
GFR: 91.11 mL/min (ref >60.00)
Glucose, Bld: 87 mg/dL (ref 70–99)
Potassium: 4.5 mEq/L (ref 3.5–5.1)
Sodium: 138 mEq/L (ref 135–145)
Total Bilirubin: 0.4 mg/dL (ref 0.2–1.2)
Total Protein: 6.8 g/dL (ref 6.0–8.3)

## 2022-09-07 LAB — POC URINALSYSI DIPSTICK (AUTOMATED)
Bilirubin, UA: NEGATIVE
Blood, UA: NEGATIVE
Glucose, UA: NEGATIVE
Ketones, UA: NEGATIVE
Leukocytes, UA: NEGATIVE
Nitrite, UA: NEGATIVE
Protein, UA: NEGATIVE
Spec Grav, UA: 1.01 (ref 1.010–1.025)
Urobilinogen, UA: 0.2 E.U./dL
pH, UA: 6 (ref 5.0–8.0)

## 2022-09-07 LAB — HEMOGLOBIN A1C: Hgb A1c MFr Bld: 5.5 % (ref 4.6–6.5)

## 2022-09-07 LAB — PSA: PSA: 0.79 ng/mL (ref 0.10–4.00)

## 2022-09-07 NOTE — Progress Notes (Unsigned)
Subjective:    Patient ID: Wayne Miller, male    DOB: 01/10/1981, 42 y.o.   MRN: [id number]  HPI  Pt in for concern about prostate. He states some urinary hesitancy when he starts flow. Some increased frequency of urination. States urinary changes started last month.  Getting up one time at night to urinate but attributes to drinking water. He stats has to wait 3-4 seconds and then states flow of urine feels slower. No hx of STD/gonorrhea.  On review mild sugar elevation in the past.  Pt also mentions purposeful wt loss 222 lb  01-01-2023 and now weighs 207 lb. He is eating less. Eats 2 meals a day. He stats feels better with wt loss.    Review of Systems  Constitutional:  Negative for chills, fatigue and fever.  HENT:  Negative for congestion, drooling, ear pain, facial swelling and hearing loss.   Respiratory:  Negative for cough, chest tightness, shortness of breath and wheezing.   Cardiovascular:  Negative for chest pain and palpitations.  Gastrointestinal:  Negative for abdominal pain and blood in stool.  Genitourinary:  Positive for frequency.       Hesitant urine flow.  Musculoskeletal:  Negative for back pain, gait problem and joint swelling.  Neurological:  Negative for dizziness, numbness and headaches.  Hematological:  Negative for adenopathy. Does not bruise/bleed easily.  Psychiatric/Behavioral:  Negative for behavioral problems and confusion.    Past Medical History:  Diagnosis Date   Blood in stool    Fatty liver    GERD (gastroesophageal reflux disease)    Herniated nucleus pulposis of lumbosacral region    Multiple gastric ulcers      Social History   Socioeconomic History   Marital status: Married    Spouse name: Not on file   Number of children: 3   Years of education: Not on file   Highest education level: Not on file  Occupational History   Not on file  Tobacco Use   Smoking status: Former    Types: Cigarettes    Quit date: 08/29/2005     Years since quitting: 17.0   Smokeless tobacco: Never  Vaping Use   Vaping Use: Never used  Substance and Sexual Activity   Alcohol use: Not Currently    Comment: past -not now   Drug use: No   Sexual activity: Yes  Other Topics Concern   Not on file  Social History Narrative   ** Merged History Encounter **       Social Determinants of Health   Financial Resource Strain: Not on file  Food Insecurity: Not on file  Transportation Needs: Not on file  Physical Activity: Not on file  Stress: Not on file  Social Connections: Not on file  Intimate Partner Violence: Not on file    Past Surgical History:  Procedure Laterality Date   EVALUATION UNDER ANESTHESIA WITH HEMORRHOIDECTOMY N/A 07/27/2022   Procedure: HEMORRHOIDECTOMY, ANORECTAL EXAM UNDER ANESTHESIA;  Surgeon: Ileana Roup, MD;  Location: [facility];  Service: General;  Laterality: N/A;   KNEE ARTHROSCOPY Left    left knee scope "tear repair"   LUMBAR LAMINECTOMY/DECOMPRESSION MICRODISCECTOMY Right 09/11/2015   Procedure: MICRO LUMBAR DECOMPRESSION OF RIGHT L4-5 AND L5-S1 AND RIGHT FORAMINOTOMY L5 (2 LEVELS);  Surgeon: Susa Day, MD;  Location: [facility];  Service: Orthopedics;  Laterality: Right;   UPPER GASTROINTESTINAL ENDOSCOPY      Family History  Problem Relation Age of Onset   Arthritis Mother  Diabetes Mother    Hyperlipidemia Mother    Hypertension Mother    Arthritis Father    Depression Father    Diabetes Father    Hypertension Father    Kidney disease Father    Birth defects Brother    Throat cancer Paternal Aunt    Arthritis Maternal Grandmother    Hypertension Maternal Grandmother    Alcohol abuse Maternal Grandfather    Prostate cancer Maternal Grandfather    Arthritis Paternal Grandmother    Colon cancer Neg Hx    Rectal cancer Neg Hx    Stomach cancer Neg Hx    Esophageal cancer Neg Hx     Allergies  Allergen Reactions   Zolpidem Nausea And Vomiting   Ibuprofen Other (See  Comments)    G I upset    Current Outpatient Medications on File Prior to Visit  Medication Sig Dispense Refill   QUEtiapine (SEROQUEL) 100 MG tablet Take 100 mg by mouth at bedtime.     No current facility-administered medications on file prior to visit.    BP 130/86   Pulse 70   Temp 98 F (36.7 C)   Resp 18   Ht 5\' 8"  (1.727 m)   Wt 207 lb (93.9 kg)   SpO2 100%   BMI 31.47 kg/m        Objective:   Physical Exam  General Mental Status- Alert. General Appearance- Not in acute distress.   Skin General: Color- Normal Color. Moisture- Normal Moisture.  Chest and Lung Exam Auscultation: Breath Sounds:-Normal.  Cardiovascular Auscultation:Rythm- Regular. Murmurs & Other Heart Sounds:Auscultation of the heart reveals- No Murmurs.  Abdomen Inspection:-Inspeection Normal. Palpation/Percussion:Note:No mass. Palpation and Percussion of the abdomen reveal- Non Tender, Non Distended + BS, no rebound or guarding.   Neurologic Cranial Nerve exam:- CN III-XII intact(No nystagmus), symmetric smile. Strength:- 5/5 equal and symmetric strength both upper and lower extremities.   DRE- deferred today as discussed will refer to urologist.     Assessment & Plan:   Patient Instructions  1. Urinary hesitand and some  Frequent urination Will follow labs and likely refer to urologist for DRE. - PSA - Comp Met (CMET) - POCT Urinalysis Dipstick (Automated)  2. Elevated blood sugar Recommend low sugar diet. - Comp Met (CMET) - Hemoglobin A1c   Follow up date to be determined after lab review.

## 2022-09-07 NOTE — Patient Instructions (Addendum)
1. Urinary hesitancy and d and some frequent urination Will follow labs and likely refer to urologist for DRE. - PSA - Comp Met (CMET) - POCT Urinalysis Dipstick (Automated)  2. Elevated blood sugar Recommend low sugar diet. - Comp Met (CMET) - Hemoglobin A1c   Follow up date to be determined after lab review.

## 2022-10-06 DIAGNOSIS — F5101 Primary insomnia: Secondary | ICD-10-CM | POA: Diagnosis not present

## 2022-11-04 ENCOUNTER — Ambulatory Visit: Payer: BC Managed Care – PPO | Admitting: Gastroenterology

## 2022-12-03 DIAGNOSIS — H10813 Pingueculitis, bilateral: Secondary | ICD-10-CM | POA: Diagnosis not present

## 2023-04-02 ENCOUNTER — Ambulatory Visit (INDEPENDENT_AMBULATORY_CARE_PROVIDER_SITE_OTHER): Payer: BC Managed Care – PPO | Admitting: Medical

## 2023-04-02 ENCOUNTER — Encounter: Payer: Self-pay | Admitting: Medical

## 2023-04-02 VITALS — BP 125/85 | HR 62 | Temp 97.7°F | Ht 68.0 in | Wt 212.0 lb

## 2023-04-02 DIAGNOSIS — N528 Other male erectile dysfunction: Secondary | ICD-10-CM | POA: Diagnosis not present

## 2023-04-02 DIAGNOSIS — Z0000 Encounter for general adult medical examination without abnormal findings: Secondary | ICD-10-CM

## 2023-04-02 DIAGNOSIS — F32A Depression, unspecified: Secondary | ICD-10-CM

## 2023-04-02 DIAGNOSIS — Z23 Encounter for immunization: Secondary | ICD-10-CM

## 2023-04-02 DIAGNOSIS — Z13220 Encounter for screening for lipoid disorders: Secondary | ICD-10-CM | POA: Diagnosis not present

## 2023-04-02 DIAGNOSIS — R5383 Other fatigue: Secondary | ICD-10-CM

## 2023-04-02 LAB — CBC WITH DIFFERENTIAL/PLATELET
Basophils Absolute: 0 10*3/uL (ref 0.0–0.1)
Basophils Relative: 0.7 % (ref 0.0–3.0)
Eosinophils Absolute: 0.2 10*3/uL (ref 0.0–0.7)
Eosinophils Relative: 3 % (ref 0.0–5.0)
HCT: 48.6 % (ref 39.0–52.0)
Hemoglobin: 15.7 g/dL (ref 13.0–17.0)
Lymphocytes Relative: 29.7 % (ref 12.0–46.0)
Lymphs Abs: 1.9 10*3/uL (ref 0.7–4.0)
MCHC: 32.3 g/dL (ref 30.0–36.0)
MCV: 94.7 fL (ref 78.0–100.0)
Monocytes Absolute: 0.4 10*3/uL (ref 0.1–1.0)
Monocytes Relative: 7 % (ref 3.0–12.0)
Neutro Abs: 3.8 10*3/uL (ref 1.4–7.7)
Neutrophils Relative %: 59.6 % (ref 43.0–77.0)
Platelets: 225 10*3/uL (ref 150.0–400.0)
RBC: 5.13 Mil/uL (ref 4.22–5.81)
RDW: 14.3 % (ref 11.5–15.5)
WBC: 6.4 10*3/uL (ref 4.0–10.5)

## 2023-04-02 LAB — VITAMIN B12: Vitamin B-12: 235 pg/mL (ref 211–911)

## 2023-04-02 LAB — COMPREHENSIVE METABOLIC PANEL
ALT: 14 U/L (ref 0–53)
AST: 12 U/L (ref 0–37)
Albumin: 4.6 g/dL (ref 3.5–5.2)
Alkaline Phosphatase: 77 U/L (ref 39–117)
BUN: 13 mg/dL (ref 6–23)
CO2: 28 meq/L (ref 19–32)
Calcium: 9.3 mg/dL (ref 8.4–10.5)
Chloride: 104 meq/L (ref 96–112)
Creatinine, Ser: 0.97 mg/dL (ref 0.40–1.50)
GFR: 95.26 mL/min (ref >60.00)
Glucose, Bld: 87 mg/dL (ref 70–99)
Potassium: 4.8 meq/L (ref 3.5–5.1)
Sodium: 140 meq/L (ref 135–145)
Total Bilirubin: 0.6 mg/dL (ref 0.2–1.2)
Total Protein: 7 g/dL (ref 6.0–8.3)

## 2023-04-02 LAB — LIPID PANEL
Cholesterol: 172 mg/dL (ref 0–200)
HDL: 46.8 mg/dL (ref >39.00)
LDL Cholesterol: 98 mg/dL (ref 0–99)
NonHDL: 125.49
Total CHOL/HDL Ratio: 4
Triglycerides: 137 mg/dL (ref 0.0–149.0)
VLDL: 27.4 mg/dL (ref 0.0–40.0)

## 2023-04-02 LAB — TSH: TSH: 0.79 u[IU]/mL (ref 0.35–5.50)

## 2023-04-02 MED ORDER — SILDENAFIL CITRATE 50 MG PO TABS: 50.0000 mg | ORAL_TABLET | Freq: Every day | ORAL | 0 refills | Status: AC | PRN

## 2023-04-02 NOTE — Progress Notes (Signed)
Subjective:    Patient ID: Wayne Miller, male    DOB: Nov 04, 1980, 42 y.o.   MRN: [id number]  HPI  Pt in for cpe/wellness exam.   Pt is fasting. Pt states minimal  exercise after work . Not much walking at work. Sits at work Research scientist (medical).  Pt states he is eating moderate healthy. No alcohol use and no smoking.  Will get flu vaccine today.   Review of Systems  Constitutional:  Positive for fatigue. Negative for chills and fever.  HENT:  Negative for congestion, ear discharge and ear pain.   Respiratory:  Negative for cough, chest tightness, shortness of breath and wheezing.   Cardiovascular:  Negative for chest pain and palpitations.  Gastrointestinal:  Negative for abdominal pain, anal bleeding, diarrhea, nausea and vomiting.  Genitourinary:  Negative for difficulty urinating, dysuria, flank pain, frequency, penile pain, scrotal swelling and urgency.       States decreased libido and some ED. He want to try viagra.   Musculoskeletal:  Negative for back pain, joint swelling and neck stiffness.  Skin:  Negative for rash.  Neurological:  Negative for facial asymmetry.  Psychiatric/Behavioral:  Positive for dysphoric mood. Negative for suicidal ideas. The patient is not nervous/anxious.        Stress. Dad passed.   No current thoughts but has rare transient brief thought for seconds. No plan. No attempts. Pt is seeing psychiatrist.  Pt was on meds and he stopped due to side effects. Has appointment with psychiatrist in 2 weeks.   Past Medical History:  Diagnosis Date   Blood in stool    Fatty liver    GERD (gastroesophageal reflux disease)    Herniated nucleus pulposis of lumbosacral region    Multiple gastric ulcers      Social History   Socioeconomic History   Marital status: Married    Spouse name: Not on file   Number of children: 3   Years of education: Not on file   Highest education level: Not on file  Occupational History   Not on file  Tobacco Use    Smoking status: Former    Current packs/day: 0.00    Types: Cigarettes    Quit date: 08/29/2005    Years since quitting: 17.6   Smokeless tobacco: Never  Vaping Use   Vaping status: Never Used  Substance and Sexual Activity   Alcohol use: Not Currently    Comment: past -not now   Drug use: No   Sexual activity: Yes  Other Topics Concern   Not on file  Social History Narrative   ** Merged History Encounter **       Social Determinants of Health   Financial Resource Strain: Not on file  Food Insecurity: Not on file  Transportation Needs: Not on file  Physical Activity: Not on file  Stress: Not on file  Social Connections: Not on file  Intimate Partner Violence: Not on file    Past Surgical History:  Procedure Laterality Date   EVALUATION UNDER ANESTHESIA WITH HEMORRHOIDECTOMY N/A 07/27/2022   Procedure: HEMORRHOIDECTOMY, ANORECTAL EXAM UNDER ANESTHESIA;  Surgeon: Andria Meuse, MD;  Location: [facility];  Service: General;  Laterality: N/A;   KNEE ARTHROSCOPY Left    left knee scope "tear repair"   LUMBAR LAMINECTOMY/DECOMPRESSION MICRODISCECTOMY Right 09/11/2015   Procedure: MICRO LUMBAR DECOMPRESSION OF RIGHT L4-5 AND L5-S1 AND RIGHT FORAMINOTOMY L5 (2 LEVELS);  Surgeon: Jene Every, MD;  Location: [facility];  Service: Orthopedics;  Laterality:  Right;   UPPER GASTROINTESTINAL ENDOSCOPY      Family History  Problem Relation Age of Onset   Arthritis Mother    Diabetes Mother    Hyperlipidemia Mother    Hypertension Mother    Arthritis Father    Depression Father    Diabetes Father    Hypertension Father    Kidney disease Father    Birth defects Brother    Throat cancer Paternal Aunt    Arthritis Maternal Grandmother    Hypertension Maternal Grandmother    Alcohol abuse Maternal Grandfather    Prostate cancer Maternal Grandfather    Arthritis Paternal Grandmother    Colon cancer Neg Hx    Rectal cancer Neg Hx    Stomach cancer Neg Hx    Esophageal cancer  Neg Hx     Allergies  Allergen Reactions   Zolpidem Nausea And Vomiting   Ibuprofen Other (See Comments)    G I upset    No current outpatient medications on file prior to visit.   No current facility-administered medications on file prior to visit.    BP 125/85   Pulse 62   Temp 97.7 F (36.5 C)   Ht 5\' 8"  (1.727 m)   Wt 212 lb (96.2 kg)   SpO2 100%   BMI 32.23 kg/m        Objective:   Physical Exam   General Mental Status- Alert. General Appearance- Not in acute distress.   Skin General: Color- Normal Color. Moisture- Normal Moisture.  Neck Carotid Arteries- Normal color. Moisture- Normal Moisture. No carotid bruits. No JVD.  Chest and Lung Exam Auscultation: Breath Sounds:-Normal.  Cardiovascular Auscultation:Rythm- Regular. Murmurs & Other Heart Sounds:Auscultation of the heart reveals- No Murmurs.  Abdomen Inspection:-Inspeection Normal. Palpation/Percussion:Note:No mass. Palpation and Percussion of the abdomen reveal- Non Tender, Non Distended + BS, no rebound or guarding.    Neurologic Cranial Nerve exam:- CN III-XII intact(No nystagmus), symmetric smile. Strength:- 5/5 equal and symmetric strength both upper and lower extremities.      Assessment & Plan:   Patient Instructions  For you wellness exam today I have ordered cbc, cmp and lipid panel.   Vaccine today flu vaccine  Recommend exercise and healthy diet.  We will let you know lab results as they come in.  Follow up date appointment will be determined after lab review.    For fatigue added b12 and tsh labs.  For depression defer to psychiatrist who has given med in past. Upcoming appointment. If any thoughts harm to self or others then advise ED [facility] [phone].  For erectile dysfunction rx viagra 50 mg prior to sex as discussed. Rx advisement given. Might consider testosterone panel in future.     Wayne Miller,  [place]   [id number] charge in addition to wellness exam. Addressed fatigue, erectile dysfunction and depression.

## 2023-04-02 NOTE — Patient Instructions (Addendum)
For you wellness exam today I have ordered cbc, cmp and lipid panel.   Vaccine today flu vaccine  Recommend exercise and healthy diet.  We will let you know lab results as they come in.  Follow up date appointment will be determined after lab review.    For fatigue added b12 and tsh labs.  For depression(phq-9 score 5) defer to psychiatrist who has given med in past. Upcoming appointment. If any thoughts harm to self or others then advise ED [facility] or [facility] [phone].  For erectile dysfunction rx viagra 50 mg prior to sex as discussed. Rx advisement given. Might consider testosterone panel in future.   Preventive Care 4-57 Years Old, Male Preventive care refers to lifestyle choices and visits with your health care provider that can promote health and wellness. Preventive care visits are also called wellness exams. What can I expect for my preventive care visit? Counseling During your preventive care visit, your health care provider may ask about your: Medical history, including: Past medical problems. Family medical history. Current health, including: Emotional well-being. Home life and relationship well-being. Sexual activity. Lifestyle, including: Alcohol, nicotine or tobacco, and drug use. Access to firearms. Diet, exercise, and sleep habits. Safety issues such as seatbelt and bike helmet use. Sunscreen use. Work and work Astronomer. Physical exam Your health care provider will check your: Height and weight. These may be used to calculate your BMI (body mass index). BMI is a measurement that tells if you are at a healthy weight. Waist circumference. This measures the distance around your waistline. This measurement also tells if you are at a healthy weight and may help predict your risk of certain diseases, such as type 2 diabetes and high blood pressure. Heart rate and blood pressure. Body temperature. Skin for  abnormal spots. What immunizations do I need?  Vaccines are usually given at various ages, according to a schedule. Your health care provider will recommend vaccines for you based on your age, medical history, and lifestyle or other factors, such as travel or where you work. What tests do I need? Screening Your health care provider may recommend screening tests for certain conditions. This may include: Lipid and cholesterol levels. Diabetes screening. This is done by checking your blood sugar (glucose) after you have not eaten for a while (fasting). Hepatitis B test. Hepatitis C test. HIV (human immunodeficiency virus) test. STI (sexually transmitted infection) testing, if you are at risk. Lung cancer screening. Prostate cancer screening. Colorectal cancer screening. Talk with your health care provider about your test results, treatment options, and if necessary, the need for more tests. Follow these instructions at home: Eating and drinking  Eat a diet that includes fresh fruits and vegetables, whole grains, lean protein, and low-fat dairy products. Take vitamin and mineral supplements as recommended by your health care provider. Do not drink alcohol if your health care provider tells you not to drink. If you drink alcohol: Limit how much you have to 0-2 drinks a day. Know how much alcohol is in your drink. In the [place] one drink equals one 12 oz bottle of beer (355 mL), one 5 oz glass of wine (148 mL), or one 1 oz glass of hard liquor (44 mL). Lifestyle Brush your teeth every morning and night with fluoride toothpaste. Floss one time each day. Exercise for at least 30 minutes 5 or more days each week. Do not use any products that contain nicotine or tobacco. These products include cigarettes,  chewing tobacco, and vaping devices, such as e-cigarettes. If you need help quitting, ask your health care provider. Do not use drugs. If you are sexually active, practice safe sex. Use a  condom or other form of protection to prevent STIs. Take aspirin only as told by your health care provider. Make sure that you understand how much to take and what form to take. Work with your health care provider to find out whether it is safe and beneficial for you to take aspirin daily. Find healthy ways to manage stress, such as: Meditation, yoga, or listening to music. Journaling. Talking to a trusted person. Spending time with friends and family. Minimize exposure to UV radiation to reduce your risk of skin cancer. Safety Always wear your seat belt while driving or riding in a vehicle. Do not drive: If you have been drinking alcohol. Do not ride with someone who has been drinking. When you are tired or distracted. While texting. If you have been using any mind-altering substances or drugs. Wear a helmet and other protective equipment during sports activities. If you have firearms in your house, make sure you follow all gun safety procedures. What's next? Go to your health care provider once a year for an annual wellness visit. Ask your health care provider how often you should have your eyes and teeth checked. Stay up to date on all vaccines. This information is not intended to replace advice given to you by your health care provider. Make sure you discuss any questions you have with your health care provider. Document Revised: 11/20/2020 Document Reviewed: 11/20/2020 Elsevier Patient Education  2024 [employer].

## 2023-07-05 DIAGNOSIS — M5459 Other low back pain: Secondary | ICD-10-CM | POA: Diagnosis not present

## 2023-07-05 DIAGNOSIS — S3992XD Unspecified injury of lower back, subsequent encounter: Secondary | ICD-10-CM | POA: Diagnosis not present

## 2023-07-05 DIAGNOSIS — M5416 Radiculopathy, lumbar region: Secondary | ICD-10-CM | POA: Diagnosis not present

## 2023-07-05 DIAGNOSIS — M5451 Vertebrogenic low back pain: Secondary | ICD-10-CM | POA: Diagnosis not present

## 2023-07-25 DIAGNOSIS — M545 Low back pain, unspecified: Secondary | ICD-10-CM | POA: Diagnosis not present

## 2023-08-06 DIAGNOSIS — M5459 Other low back pain: Secondary | ICD-10-CM | POA: Diagnosis not present

## 2023-08-06 DIAGNOSIS — S3992XD Unspecified injury of lower back, subsequent encounter: Secondary | ICD-10-CM | POA: Diagnosis not present

## 2023-08-06 DIAGNOSIS — M5451 Vertebrogenic low back pain: Secondary | ICD-10-CM | POA: Diagnosis not present

## 2023-08-06 DIAGNOSIS — M5416 Radiculopathy, lumbar region: Secondary | ICD-10-CM | POA: Diagnosis not present

## 2023-08-21 DIAGNOSIS — M47816 Spondylosis without myelopathy or radiculopathy, lumbar region: Secondary | ICD-10-CM | POA: Diagnosis not present

## 2023-09-07 DIAGNOSIS — M791 Myalgia, unspecified site: Secondary | ICD-10-CM | POA: Diagnosis not present

## 2023-09-07 DIAGNOSIS — M5451 Vertebrogenic low back pain: Secondary | ICD-10-CM | POA: Diagnosis not present

## 2023-09-07 DIAGNOSIS — M5416 Radiculopathy, lumbar region: Secondary | ICD-10-CM | POA: Diagnosis not present

## 2023-09-07 DIAGNOSIS — S3992XD Unspecified injury of lower back, subsequent encounter: Secondary | ICD-10-CM | POA: Diagnosis not present

## 2023-11-08 ENCOUNTER — Ambulatory Visit: Admitting: Medical

## 2023-11-08 ENCOUNTER — Encounter: Payer: Self-pay | Admitting: Medical

## 2023-11-08 ENCOUNTER — Ambulatory Visit: Payer: Self-pay | Admitting: Medical

## 2023-11-08 VITALS — BP 137/87 | HR 70 | Resp 18 | Ht 68.0 in | Wt 215.2 lb

## 2023-11-08 DIAGNOSIS — E538 Deficiency of other specified B group vitamins: Secondary | ICD-10-CM | POA: Diagnosis not present

## 2023-11-08 DIAGNOSIS — R42 Dizziness and giddiness: Secondary | ICD-10-CM

## 2023-11-08 LAB — CBC WITH DIFFERENTIAL/PLATELET
Basophils Absolute: 0 10*3/uL (ref 0.0–0.1)
Basophils Relative: 0.8 % (ref 0.0–3.0)
Eosinophils Absolute: 0.2 10*3/uL (ref 0.0–0.7)
Eosinophils Relative: 4.1 % (ref 0.0–5.0)
HCT: 46.9 % (ref 39.0–52.0)
Hemoglobin: 15.5 g/dL (ref 13.0–17.0)
Lymphocytes Relative: 29.9 % (ref 12.0–46.0)
Lymphs Abs: 1.8 10*3/uL (ref 0.7–4.0)
MCHC: 33 g/dL (ref 30.0–36.0)
MCV: 92.8 fl (ref 78.0–100.0)
Monocytes Absolute: 0.5 10*3/uL (ref 0.1–1.0)
Monocytes Relative: 7.6 % (ref 3.0–12.0)
Neutro Abs: 3.5 10*3/uL (ref 1.4–7.7)
Neutrophils Relative %: 57.6 % (ref 43.0–77.0)
Platelets: 239 10*3/uL (ref 150.0–400.0)
RBC: 5.05 Mil/uL (ref 4.22–5.81)
RDW: 14 % (ref 11.5–15.5)
WBC: 6 10*3/uL (ref 4.0–10.5)

## 2023-11-08 LAB — COMPREHENSIVE METABOLIC PANEL WITH GFR
ALT: 20 U/L (ref 0–53)
AST: 19 U/L (ref 0–37)
Albumin: 4.6 g/dL (ref 3.5–5.2)
Alkaline Phosphatase: 61 U/L (ref 39–117)
BUN: 15 mg/dL (ref 6–23)
CO2: 24 meq/L (ref 19–32)
Calcium: 9.3 mg/dL (ref 8.4–10.5)
Chloride: 107 meq/L (ref 96–112)
Creatinine, Ser: 1.01 mg/dL (ref 0.40–1.50)
GFR: 90.37 mL/min (ref >60.00)
Glucose, Bld: 97 mg/dL (ref 70–99)
Potassium: 4.9 meq/L (ref 3.5–5.1)
Sodium: 140 meq/L (ref 135–145)
Total Bilirubin: 0.7 mg/dL (ref 0.2–1.2)
Total Protein: 6.9 g/dL (ref 6.0–8.3)

## 2023-11-08 LAB — VITAMIN B12: Vitamin B-12: 385 pg/mL (ref 211–911)

## 2023-11-08 NOTE — Progress Notes (Signed)
   Subjective:    Patient ID: Wayne Miller, male    DOB: [birth year], 43 y.o.   MRN: [id number]  HPI  Wayne Miller is a 43 year old male who presents with dizziness.  He experiences vertigo that is  rare/brief and episodic, particularly when lying down and turning his head. The dizziness is described as a spinning sensation and has been occurring for the past two weeks, with episodes happening four to five times in total. Each episode lasts for seconds, with the most recent occurrence being yesterday. No associated symptoms like fatigue, headaches, changes in vision, or gross motor/sensory functions deficits.  He has a history of low vitamin B12 levels, with a previous measurement of 235 pg/mL in October. He is not currently taking vitamin B12 supplements.  He does not consume alcohol and there is no mention of any dietary restrictions or specific diet.     Review of Systems  Constitutional:  Negative for chills, fatigue and fever.  Respiratory:  Negative for cough, chest tightness and wheezing.   Cardiovascular:  Negative for chest pain and palpitations.  Gastrointestinal:  Negative for abdominal pain.  Genitourinary:  Negative for dysuria and frequency.  Musculoskeletal:  Negative for back pain and myalgias.  Skin:  Negative for rash.  Neurological:  Negative for tremors, seizures, syncope, speech difficulty, weakness and headaches.       See hpi and exam. Rare vertigo  Hematological:  Negative for adenopathy. Does not bruise/bleed easily.  Psychiatric/Behavioral:  Negative for behavioral problems, dysphoric mood and suicidal ideas. The patient is not nervous/anxious.        None reported        Objective:   Physical Exam  General Mental Status- Alert. General Appearance- Not in acute distress.   Skin General: Color- Normal Color. Moisture- Normal Moisture.  Neck Carotid Arteries- Normal color. Moisture- Normal Moisture. No carotid bruits. No JVD.  Chest and Lung  Exam Auscultation: Breath Sounds:-CTA  Cardiovascular Auscultation:Rythm- RRR Murmurs & Other Heart Sounds:Auscultation of the heart reveals- No Murmurs.  Abdomen Inspection:-Inspeection Normal. Palpation/Percussion:Note:No mass. Palpation and Percussion of the abdomen reveal- Non Tender, Non Distended + BS, no rebound or guarding.    Neurologic Cranial Nerve exam:- CN III-XII intact(No nystagmus), symmetric smile. Drift Test:- No drift. Romberg Exam:- Negative.  Heal to Toe Gait exam:-Normal. Finger to Nose:- Normal/Intact Strength:- 5/5 equal and symmetric strength both upper and lower extremities.  -lying supine and turning head to left provoke mild brief vertigo. When heard returns midline vertigo resolves quickly.     Assessment & Plan:  Benign paroxysmal positional vertigo (BPPV) Likely BPPV due to otoliths in the inner ear. - Refer to physical therapy for Epley maneuvers. - Advised to not operate heavy equipmentor climbing ladders if experiencing vertigo - Instructed to inform employer if  occurs at work. - Instructed to contact if not reached by physical therapy office by Thursday morning. -cbc and cmp lab. -if ever vertigo with motor or sensory deficts then be seen in the ED.  Vitamin B12 deficiency Vitamin B12 deficiency with previous level of 235 pg/mL. Currently not on supplements. - Order CBC, metabolic panel, and Vitamin B12 level.  Follow up date to be determined after lab review.  Dainelle Hun, PA-C

## 2023-11-08 NOTE — Patient Instructions (Signed)
 Benign paroxysmal positional vertigo (BPPV) Likely BPPV due to otoliths in the inner ear. - Refer to physical therapy for Epley maneuvers. - Advised to not operate heavy equipmentor climbing ladders if experiencing vertigo - Instructed to inform employer if  occurs at work. - Instructed to contact if not reached by physical therapy office by Thursday morning. -cbc and cmp lab. -if ever vertigo with motor or sensory deficts then be seen in the ED.  Vitamin B12 deficiency Vitamin B12 deficiency with previous level of 235 pg/mL. Currently not on supplements. - Order CBC, metabolic panel, and Vitamin B12 level.  Follow up date to be determined after lab review.

## 2023-11-15 ENCOUNTER — Telehealth: Payer: Self-pay

## 2023-11-15 NOTE — Telephone Encounter (Signed)
 Patient wanted to go over his last lab results. Patient notified all labs were normal.

## 2023-11-15 NOTE — Telephone Encounter (Signed)
 Could you contact pt for me ?  Copied from CRM [id number]. Topic: Clinical - Medical Advice >> Nov 15, 2023  8:48 AM Dorthula Gavel H wrote: Reason for CRM: Pt would like a call back about last week's visit

## 2023-11-30 ENCOUNTER — Ambulatory Visit

## 2023-12-08 DIAGNOSIS — M5451 Vertebrogenic low back pain: Secondary | ICD-10-CM | POA: Diagnosis not present

## 2023-12-08 DIAGNOSIS — M5459 Other low back pain: Secondary | ICD-10-CM | POA: Diagnosis not present

## 2023-12-08 DIAGNOSIS — S3992XD Unspecified injury of lower back, subsequent encounter: Secondary | ICD-10-CM | POA: Diagnosis not present

## 2023-12-08 DIAGNOSIS — M5416 Radiculopathy, lumbar region: Secondary | ICD-10-CM | POA: Diagnosis not present

## 2023-12-17 ENCOUNTER — Encounter (HOSPITAL_COMMUNITY): Payer: Self-pay | Admitting: Emergency Medicine

## 2023-12-17 ENCOUNTER — Ambulatory Visit (HOSPITAL_COMMUNITY)
Admission: EM | Admit: 2023-12-17 | Discharge: 2023-12-17 | Disposition: A | Discharge status: Home / Self Care | Attending: Physician Assistant | Admitting: Physician Assistant

## 2023-12-17 DIAGNOSIS — H60393 Other infective otitis externa, bilateral: Secondary | ICD-10-CM

## 2023-12-17 MED ORDER — CIPROFLOXACIN-DEXAMETHASONE 0.3-0.1 % OT SUSP: 4.0000 [drp] | Freq: Two times a day (BID) | OTIC | 0 refills | Status: AC

## 2023-12-17 NOTE — ED Triage Notes (Signed)
 Pt c/o bilateral ear pain for a couple days. Bougth OTC ear rign relief drops that didn't help.

## 2023-12-17 NOTE — ED Provider Notes (Signed)
 [facility]    CSN: [id number] Arrival date & time: 12/17/23  0800      History   Chief Complaint Chief Complaint  Patient presents with   Otalgia    HPI Wayne Miller is a 43 y.o. male.   Patient today for evaluation of bilateral ear pain that started a couple days ago.  He reports that he tried an over-the-counter ear relief drops without improvement.  He has not had fever.  He has had drainage from his ears.  He does not report other symptoms.  The history is provided by the patient.  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   Vitamin D  deficiency 05/13/2018   Spinal stenosis of lumbar region 09/11/2015    Past Surgical History:  Procedure Laterality Date   EVALUATION UNDER ANESTHESIA WITH HEMORRHOIDECTOMY N/A 07/27/2022   Procedure: HEMORRHOIDECTOMY, ANORECTAL EXAM UNDER ANESTHESIA;  Surgeon: Teresa Lonni HERO, MD;  Location: [facility];  Service: General;  Laterality: N/A;   KNEE ARTHROSCOPY Left    left knee scope tear repair   LUMBAR LAMINECTOMY/DECOMPRESSION MICRODISCECTOMY Right 09/11/2015   Procedure: MICRO LUMBAR DECOMPRESSION OF RIGHT L4-5 AND L5-S1 AND RIGHT FORAMINOTOMY L5 (2 LEVELS);  Surgeon: Reyes Billing, MD;  Location: [facility];  Service: Orthopedics;  Laterality: Right;   UPPER GASTROINTESTINAL ENDOSCOPY         Home Medications    Prior to Admission medications   Medication Sig Start Date End Date Taking? Authorizing Provider  ciprofloxacin -dexamethasone  (CIPRODEX ) OTIC suspension Place 4 drops into  both ears 2 (two) times daily for 7 days. 12/17/23 12/24/23 Yes Billy Asberry FALCON, PA-C  sildenafil  (VIAGRA ) 50 MG tablet Take 1 tablet (50 mg total) by mouth daily as needed for erectile dysfunction. 04/02/23   Saguier, [place], PA-C    Family History Family History  Problem Relation Age of Onset   Arthritis Mother    Diabetes Mother    Hyperlipidemia Mother    Hypertension Mother    Arthritis Father    Depression Father    Diabetes Father    Hypertension Father    Kidney disease Father    Birth defects Brother    Throat cancer Paternal Aunt    Arthritis Maternal Grandmother    Hypertension Maternal Grandmother    Alcohol abuse Maternal Grandfather    Prostate cancer Maternal Grandfather    Arthritis Paternal Grandmother    Colon cancer Neg Hx    Rectal cancer Neg Hx    Stomach cancer Neg Hx    Esophageal cancer Neg Hx     Social History Social History   Tobacco Use   Smoking status: Former    Current packs/day: 0.00    Types: Cigarettes    Quit date: 08/29/2005    Years since quitting: 18.3   Smokeless tobacco: Never  Vaping Use   Vaping status: Never Used  Substance Use Topics   Alcohol use: Not Currently    Comment: past -not now   Drug use: No  Allergies   Zolpidem and Ibuprofen   Review of Systems Review of Systems  Constitutional:  Negative for chills and fever.  HENT:  Positive for ear discharge and ear pain. Negative for congestion and sore throat.   Eyes:  Negative for discharge and redness.  Respiratory:  Negative for cough and shortness of breath.   Gastrointestinal:  Negative for abdominal pain, nausea and vomiting.     Physical Exam Triage Vital Signs ED Triage Vitals  Encounter Vitals Group     BP 12/17/23 0824 132/88     Girls Systolic BP Percentile --      Girls Diastolic BP Percentile --      Boys Systolic BP Percentile --      Boys Diastolic BP Percentile --      Pulse Rate 12/17/23 0824 75     Resp 12/17/23 0824 15     Temp  12/17/23 0824 98.4 F (36.9 C)     Temp Source 12/17/23 0824 Oral     SpO2 12/17/23 0824 96 %     Weight --      Height --      Head Circumference --      Peak Flow --      Pain Score 12/17/23 0822 8     Pain Loc --      Pain Education --      Exclude from Growth Chart --    No data found.  Updated Vital Signs BP 132/88 (BP Location: Right Arm)   Pulse 75   Temp 98.4 F (36.9 C) (Oral)   Resp 15   SpO2 96%   Visual Acuity Right Eye Distance:   Left Eye Distance:   Bilateral Distance:    Right Eye Near:   Left Eye Near:    Bilateral Near:     Physical Exam Vitals and nursing note reviewed.  Constitutional:      General: He is not in acute distress.    Appearance: Normal appearance. He is not ill-appearing.  HENT:     Head: Normocephalic and atraumatic.     Ears:     Comments: Unable to fully visualize TMs bilaterally due to erythematous and inflamed EACs, tragal tenderness noted bilaterally, purulent exudate appreciated bilaterally with more drainage noted to EAC on left    Nose: Nose normal. No congestion.     Mouth/Throat:     Mouth: Mucous membranes are moist.  Eyes:     Conjunctiva/sclera: Conjunctivae normal.  Cardiovascular:     Rate and Rhythm: Normal rate.  Pulmonary:     Effort: Pulmonary effort is normal. No respiratory distress.  Skin:    General: Skin is warm and dry.  Neurological:     Mental Status: He is alert.  Psychiatric:        Mood and Affect: Mood normal.        Thought Content: Thought content normal.      UC Treatments / Results  Labs (all labs ordered are listed, but only abnormal results are displayed) Labs Reviewed - No data to display  EKG   Radiology No results found.  Procedures Procedures (including critical care time)  Medications Ordered in UC Medications - No data to display  Initial Impression / Assessment and Plan / UC Course  I have reviewed the triage vital signs and the nursing notes.  Pertinent labs  & imaging results that were available during my care of the patient were reviewed by me and considered in my medical  decision making (see chart for details).    Will treat to cover otitis externa with Ciprodex  drops.  Advise follow-up if no gradual improvement with any further concerns.  Discussed that he could use Tylenol  and/or ibuprofen if needed for pain.  Final Clinical Impressions(s) / UC Diagnoses   Final diagnoses:  Other infective acute otitis externa of both ears   Discharge Instructions   None    ED Prescriptions     Medication Sig Dispense Auth. Provider   ciprofloxacin -dexamethasone  (CIPRODEX ) OTIC suspension Place 4 drops into both ears 2 (two) times daily for 7 days. 7.5 mL Billy Asberry FALCON, PA-C      PDMP not reviewed this encounter.   Billy Asberry FALCON, PA-C 12/17/23 [phone]

## 2023-12-28 DIAGNOSIS — M5416 Radiculopathy, lumbar region: Secondary | ICD-10-CM | POA: Diagnosis not present

## 2024-01-11 DIAGNOSIS — M5451 Vertebrogenic low back pain: Secondary | ICD-10-CM | POA: Diagnosis not present

## 2024-01-31 DIAGNOSIS — M5451 Vertebrogenic low back pain: Secondary | ICD-10-CM | POA: Diagnosis not present

## 2024-02-14 DIAGNOSIS — S3992XD Unspecified injury of lower back, subsequent encounter: Secondary | ICD-10-CM | POA: Diagnosis not present

## 2024-02-14 DIAGNOSIS — M5451 Vertebrogenic low back pain: Secondary | ICD-10-CM | POA: Diagnosis not present

## 2024-02-14 DIAGNOSIS — M5416 Radiculopathy, lumbar region: Secondary | ICD-10-CM | POA: Diagnosis not present

## 2024-02-14 DIAGNOSIS — M791 Myalgia, unspecified site: Secondary | ICD-10-CM | POA: Diagnosis not present

## 2024-03-15 DIAGNOSIS — M545 Low back pain, unspecified: Secondary | ICD-10-CM | POA: Diagnosis not present

## 2024-03-16 ENCOUNTER — Other Ambulatory Visit: Payer: Self-pay | Admitting: Orthopedic Surgery

## 2024-03-16 DIAGNOSIS — M545 Low back pain, unspecified: Secondary | ICD-10-CM

## 2024-04-13 ENCOUNTER — Telehealth: Payer: Self-pay

## 2024-04-13 NOTE — Progress Notes (Signed)
 See telephone call note.

## 2024-04-13 NOTE — Discharge Instructions (Signed)
Intra-Discal Anesthetic Injection Discharge Instruction Sheet  You may resume a regular diet and any medications that you routinely take (including pain medications).  No driving day of procedure.  Light activity throughout the rest of the day.  Do not do any strenuous work, exercise, bending or lifting.  The day following the procedure, you can resume normal physical activity but you should refrain from exercising or physical therapy for at least three days thereafter.   Please contact our office at [phone] for the following symptoms: Fever greater than 100 degrees. Headaches unresolved with medication after 2-3 days. 

## 2024-04-14 ENCOUNTER — Ambulatory Visit
Admission: RE | Admit: 2024-04-14 | Discharge: 2024-04-14 | Disposition: A | Discharge status: Home / Self Care | Source: Ambulatory Visit | Attending: Orthopedic Surgery | Admitting: Orthopedic Surgery

## 2024-04-14 DIAGNOSIS — M545 Low back pain, unspecified: Secondary | ICD-10-CM

## 2024-04-14 DIAGNOSIS — G8929 Other chronic pain: Secondary | ICD-10-CM | POA: Diagnosis not present

## 2024-04-14 MED ORDER — CEFAZOLIN SODIUM-DEXTROSE 2-4 GM/100ML-% IV SOLN
2.0000 g | INTRAVENOUS | Status: AC
  Administered 2024-04-14: 2 g via INTRAVENOUS

## 2024-04-14 MED ORDER — SODIUM CHLORIDE 0.9 % IV SOLN: INTRAVENOUS | Status: DC

## 2024-04-26 DIAGNOSIS — M5459 Other low back pain: Secondary | ICD-10-CM | POA: Diagnosis not present

## 2024-04-26 DIAGNOSIS — M47816 Spondylosis without myelopathy or radiculopathy, lumbar region: Secondary | ICD-10-CM | POA: Diagnosis not present

## 2024-05-06 DIAGNOSIS — M5416 Radiculopathy, lumbar region: Secondary | ICD-10-CM | POA: Diagnosis not present
